# Patient Record
Sex: Female | Born: 1950 | Race: White | Hispanic: No | Marital: Married | Smoking: Never smoker
Health system: Southern US, Community
[De-identification: ages and names within clinical notes are randomized; demographics above are authoritative.]

## PROBLEM LIST (undated history)

## (undated) DIAGNOSIS — D649 Anemia, unspecified: Secondary | ICD-10-CM

## (undated) DIAGNOSIS — K219 Gastro-esophageal reflux disease without esophagitis: Secondary | ICD-10-CM

## (undated) DIAGNOSIS — Z8619 Personal history of other infectious and parasitic diseases: Secondary | ICD-10-CM

## (undated) DIAGNOSIS — C801 Malignant (primary) neoplasm, unspecified: Secondary | ICD-10-CM

## (undated) DIAGNOSIS — L709 Acne, unspecified: Secondary | ICD-10-CM

## (undated) DIAGNOSIS — R739 Hyperglycemia, unspecified: Secondary | ICD-10-CM

## (undated) DIAGNOSIS — E079 Disorder of thyroid, unspecified: Secondary | ICD-10-CM

## (undated) DIAGNOSIS — E785 Hyperlipidemia, unspecified: Secondary | ICD-10-CM

## (undated) DIAGNOSIS — I10 Essential (primary) hypertension: Secondary | ICD-10-CM

## (undated) DIAGNOSIS — K635 Polyp of colon: Secondary | ICD-10-CM

## (undated) DIAGNOSIS — H04129 Dry eye syndrome of unspecified lacrimal gland: Secondary | ICD-10-CM

## (undated) DIAGNOSIS — R011 Cardiac murmur, unspecified: Secondary | ICD-10-CM

## (undated) DIAGNOSIS — G4730 Sleep apnea, unspecified: Secondary | ICD-10-CM

## (undated) DIAGNOSIS — IMO0002 Reserved for concepts with insufficient information to code with codable children: Secondary | ICD-10-CM

## (undated) DIAGNOSIS — M1990 Unspecified osteoarthritis, unspecified site: Secondary | ICD-10-CM

## (undated) HISTORY — DX: Dry eye syndrome of unspecified lacrimal gland: H04.129

## (undated) HISTORY — DX: Disorder of thyroid, unspecified: E07.9

## (undated) HISTORY — PX: SPINAL CORD STIMULATOR IMPLANT: SHX2422

## (undated) HISTORY — DX: Essential (primary) hypertension: I10

## (undated) HISTORY — DX: Sleep apnea, unspecified: G47.30

## (undated) HISTORY — DX: Reserved for concepts with insufficient information to code with codable children: IMO0002

## (undated) HISTORY — DX: Polyp of colon: K63.5

## (undated) HISTORY — DX: Hyperlipidemia, unspecified: E78.5

## (undated) HISTORY — DX: Anemia, unspecified: D64.9

## (undated) HISTORY — DX: Unspecified osteoarthritis, unspecified site: M19.90

## (undated) HISTORY — DX: Gastro-esophageal reflux disease without esophagitis: K21.9

## (undated) HISTORY — DX: Malignant (primary) neoplasm, unspecified: C80.1

## (undated) HISTORY — DX: Hyperglycemia, unspecified: R73.9

## (undated) HISTORY — PX: WISDOM TOOTH EXTRACTION: SHX21

## (undated) HISTORY — DX: Cardiac murmur, unspecified: R01.1

## (undated) HISTORY — DX: Personal history of other infectious and parasitic diseases: Z86.19

## (undated) HISTORY — DX: Acne, unspecified: L70.9

---
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
## 1994-12-01 DIAGNOSIS — C4491 Basal cell carcinoma of skin, unspecified: Secondary | ICD-10-CM

## 1994-12-01 HISTORY — DX: Basal cell carcinoma of skin, unspecified: C44.91

## 1997-08-14 ENCOUNTER — Other Ambulatory Visit: Admission: RE | Admit: 1997-08-14 | Discharge: 1997-08-14 | Payer: Self-pay | Admitting: Obstetrics and Gynecology

## 2002-06-26 DIAGNOSIS — E041 Nontoxic single thyroid nodule: Secondary | ICD-10-CM | POA: Insufficient documentation

## 2002-11-01 ENCOUNTER — Other Ambulatory Visit: Admission: RE | Admit: 2002-11-01 | Discharge: 2002-11-01 | Payer: Self-pay | Admitting: Diagnostic Radiology

## 2005-12-17 ENCOUNTER — Ambulatory Visit (HOSPITAL_BASED_OUTPATIENT_CLINIC_OR_DEPARTMENT_OTHER): Admission: RE | Admit: 2005-12-17 | Discharge: 2005-12-17 | Payer: Self-pay | Admitting: Family Medicine

## 2005-12-21 ENCOUNTER — Ambulatory Visit: Payer: Self-pay | Admitting: Internal Medicine

## 2006-02-02 ENCOUNTER — Ambulatory Visit (HOSPITAL_BASED_OUTPATIENT_CLINIC_OR_DEPARTMENT_OTHER): Admission: RE | Admit: 2006-02-02 | Discharge: 2006-02-02 | Payer: Self-pay | Admitting: Family Medicine

## 2006-02-15 ENCOUNTER — Ambulatory Visit: Payer: Self-pay | Admitting: Internal Medicine

## 2007-12-13 ENCOUNTER — Encounter: Payer: Self-pay | Admitting: Family Medicine

## 2007-12-29 ENCOUNTER — Encounter: Payer: Self-pay | Admitting: Family Medicine

## 2008-02-18 LAB — HM COLONOSCOPY

## 2009-01-30 ENCOUNTER — Encounter: Payer: Self-pay | Admitting: Family Medicine

## 2009-02-04 ENCOUNTER — Emergency Department (HOSPITAL_COMMUNITY): Admission: EM | Admit: 2009-02-04 | Discharge: 2009-02-04 | Payer: Self-pay | Admitting: Emergency Medicine

## 2009-02-06 ENCOUNTER — Encounter: Admission: RE | Admit: 2009-02-06 | Discharge: 2009-02-06 | Payer: Self-pay | Admitting: Family Medicine

## 2009-02-06 ENCOUNTER — Encounter: Payer: Self-pay | Admitting: Family Medicine

## 2009-07-30 ENCOUNTER — Encounter: Payer: Self-pay | Admitting: Family Medicine

## 2009-12-11 ENCOUNTER — Encounter
Admission: RE | Admit: 2009-12-11 | Discharge: 2010-03-11 | Payer: Self-pay | Source: Home / Self Care | Attending: Family Medicine | Admitting: Family Medicine

## 2010-02-05 ENCOUNTER — Encounter
Admission: RE | Admit: 2010-02-05 | Discharge: 2010-02-05 | Payer: Self-pay | Source: Home / Self Care | Attending: Unknown Physician Specialty | Admitting: Unknown Physician Specialty

## 2010-03-15 ENCOUNTER — Encounter: Payer: Self-pay | Admitting: Family Medicine

## 2010-03-26 ENCOUNTER — Ambulatory Visit: Payer: Self-pay | Admitting: *Deleted

## 2010-03-26 ENCOUNTER — Encounter: Admit: 2010-03-26 | Payer: Self-pay | Admitting: Family Medicine

## 2010-04-23 ENCOUNTER — Ambulatory Visit: Payer: Self-pay | Admitting: *Deleted

## 2010-04-24 ENCOUNTER — Ambulatory Visit (INDEPENDENT_AMBULATORY_CARE_PROVIDER_SITE_OTHER): Payer: PRIVATE HEALTH INSURANCE | Admitting: Family Medicine

## 2010-04-24 ENCOUNTER — Other Ambulatory Visit: Payer: Self-pay | Admitting: Family Medicine

## 2010-04-24 ENCOUNTER — Encounter: Payer: Self-pay | Admitting: Family Medicine

## 2010-04-24 DIAGNOSIS — R030 Elevated blood-pressure reading, without diagnosis of hypertension: Secondary | ICD-10-CM | POA: Insufficient documentation

## 2010-04-24 DIAGNOSIS — Z1231 Encounter for screening mammogram for malignant neoplasm of breast: Secondary | ICD-10-CM

## 2010-04-24 DIAGNOSIS — R011 Cardiac murmur, unspecified: Secondary | ICD-10-CM | POA: Insufficient documentation

## 2010-04-24 DIAGNOSIS — M8580 Other specified disorders of bone density and structure, unspecified site: Secondary | ICD-10-CM | POA: Insufficient documentation

## 2010-04-24 DIAGNOSIS — Z85828 Personal history of other malignant neoplasm of skin: Secondary | ICD-10-CM | POA: Insufficient documentation

## 2010-04-24 DIAGNOSIS — D649 Anemia, unspecified: Secondary | ICD-10-CM

## 2010-04-24 DIAGNOSIS — E7800 Pure hypercholesterolemia, unspecified: Secondary | ICD-10-CM

## 2010-04-24 DIAGNOSIS — M810 Age-related osteoporosis without current pathological fracture: Secondary | ICD-10-CM

## 2010-04-24 DIAGNOSIS — E079 Disorder of thyroid, unspecified: Secondary | ICD-10-CM

## 2010-04-24 DIAGNOSIS — E042 Nontoxic multinodular goiter: Secondary | ICD-10-CM | POA: Insufficient documentation

## 2010-04-24 DIAGNOSIS — Z23 Encounter for immunization: Secondary | ICD-10-CM

## 2010-04-24 DIAGNOSIS — D126 Benign neoplasm of colon, unspecified: Secondary | ICD-10-CM | POA: Insufficient documentation

## 2010-04-24 DIAGNOSIS — Z9189 Other specified personal risk factors, not elsewhere classified: Secondary | ICD-10-CM | POA: Insufficient documentation

## 2010-04-24 LAB — CBC WITH DIFFERENTIAL/PLATELET
Basophils Absolute: 0.1 10*3/uL (ref 0.0–0.1)
Basophils Relative: 0.7 % (ref 0.0–3.0)
Eosinophils Relative: 5.8 % — ABNORMAL HIGH (ref 0.0–5.0)
HCT: 37.2 % (ref 36.0–46.0)
Hemoglobin: 12.4 g/dL (ref 12.0–15.0)
Lymphocytes Relative: 23.8 % (ref 12.0–46.0)
Lymphs Abs: 1.6 10*3/uL (ref 0.7–4.0)
Monocytes Relative: 11 % (ref 3.0–12.0)
Neutro Abs: 4 10*3/uL (ref 1.4–7.7)
RBC: 4.28 Mil/uL (ref 3.87–5.11)
WBC: 6.9 10*3/uL (ref 4.5–10.5)

## 2010-04-24 LAB — IBC PANEL
Saturation Ratios: 14.8 % — ABNORMAL LOW (ref 20.0–50.0)
Transferrin: 346.9 mg/dL (ref 212.0–360.0)

## 2010-04-30 NOTE — Letter (Signed)
Summary: Patient Questionnaire  Patient Questionnaire   Imported By: Beau Fanny 04/24/2010 14:24:35  _____________________________________________________________________  External Attachment:    Type:   Image     Comment:   External Document

## 2010-04-30 NOTE — Assessment & Plan Note (Signed)
Summary: NEW PATIENT EST. / LFW   Vital Signs:  Patient profile:   60 year old female Height:      61.5 inches Weight:      208 pounds BMI:     38.80 Temp:     97.9 degrees F oral Pulse rate:   84 / minute Pulse rhythm:   regular BP sitting:   126 / 84  (left arm) Cuff size:   large  Vitals Entered By: Lewanda Rife LPN (April 23, 1476 11:21 AM) CC: new pt to establish   History of Present Illness: here to get established as new pt complex med history  [facility] on [place] - and they closed   HTN - well controlled on diet and exercise - no meds for 6 years  hyperlipidemia -- controlled on statin -- but has been out of it for a month   obesity with bmi of 38  hyperglycemia - watching with AIC of 5.8 this summer  mindful now of sugar and carbs in diet   sleep apnea on cpap which helps  does not see a specialist for that -- has been more than 5 years ago - working great   gerd on med - prilosec two times a day   OP wiht dexa 11/09- FN t score of -2.5 is due for one  no meds - never disc it  takes ca and vit D  vit D def- 22 in june -- is low -- takes 2000 international units per day  at one time took weekly therapy  pap 10/09  mam 12/10-- is overdue needs to schedule no lumps on self exam  labs in 6/11  Td over 10 years go   ptx 2005-- will get that in the fall  gets flu shots in oct   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 what the plan will be for that  has not seen endocrinologist   hx of anemia -10.? [facility]    Preventive Screening-Counseling & Management  Alcohol-Tobacco     Smoking Status: never  Caffeine-Diet-Exercise     Does Patient Exercise: no      Drug Use:  no.    Allergies (verified): 1)  ! * Nitrous Oxide  Past History:  Past Medical History: Anemia-NOS Skin cancer, hx of arthritis spine- deg joint dz (pain clinic)  hx of chicken pox GERD Heart murmur HTN  high cholesterol polyps in colon thyroid problem hyperglycemia  sleep apnea with cpap acne dry eye syndrome osteoporosis vit D deficiency thyroid nodule with neg bx in the past    cardiol- [place] opthy Ellie Lunch at [facility]  Tafeen-derm   Past Surgical History: Tonsillectomy 1973 colonosc 1/10-- polyp- needs every 3 years  Family History: father living arthritis, elevated cholesterol, heart disease,highBP mother:living arthritis,diabetes, thyroid cancer maternal grandmother:kidney disease and diabetes brother: heart disease stent before age 26  and high blood pressure, thyroid cancer   no breast or colon cancer   Social History: Occupation:accountant Married Never Smoked Alcohol use-yes Drug use-no Regular exercise-no G2P2 Occupation:  employed Does Patient Exercise:  no Smoking Status:  never Drug Use:  no  Review of Systems General:  Denies fatigue and malaise. Eyes:  Denies blurring and eye irritation. CV:  Denies chest pain or discomfort, lightheadness, and palpitations. Resp:  Denies cough, shortness of breath, and wheezing. GI:  Denies abdominal pain and indigestion. GU:  Denies dysuria and urinary frequency. MS:  Complains of low back pain, mid back pain, and stiffness; denies joint redness, joint swelling, and muscle weakness. Derm:  Denies lesion(s), poor wound healing, and rash. Neuro:  Denies numbness and tingling. Psych:  Denies anxiety and depression. Endo:  Denies cold intolerance, excessive thirst, excessive urination, and heat  intolerance. Heme:  Denies abnormal bruising and bleeding.  Physical Exam  General:  overweight but generally well appearing  Head:  normocephalic, atraumatic, and no abnormalities observed.   Eyes:  vision grossly intact, pupils equal, pupils round, and pupils reactive to light.   Nose:  no nasal discharge.   Mouth:  pharynx pink and moist.   Neck:  supple with full rom and no masses or thyromegally, no JVD or carotid bruit  Chest Wall:  No deformities, masses, or tenderness noted. Lungs:  Normal respiratory effort, chest expands symmetrically. Lungs are clear to auscultation, no crackles or wheezes. Heart:  Normal rate and regular rhythm. S1 and S2 normal without gallop, murmur, click, rub or other extra sounds. Abdomen:  Bowel sounds positive,abdomen soft and non-tender without masses, organomegaly or hernias noted. no renal  bruits  Msk:  No deformity or scoliosis noted of thoracic or lumbar spine.  poor rom back  Pulses:  R and L carotid,radial,femoral,dorsalis pedis and posterior tibial pulses are full and equal bilaterally Extremities:  No clubbing, cyanosis, edema, or deformity noted with normal full range of motion of all joints.   Neurologic:  sensation intact to light touch, gait normal, and DTRs symmetrical and normal.   Skin:  Intact without suspicious lesions or rashes Cervical Nodes:  No lymphadenopathy noted Inguinal Nodes:  No significant adenopathy Psych:  normal affect, talkative and pleasant    Impression & Recommendations:  Problem # 1:  OSTEOPOROSIS (ICD-733.00) Assessment New schedule dexa  no meds at present  rev ca and vit D disc results after  Her updated medication list for this problem includes:    Vitamin D 1000 Unit Tabs (Cholecalciferol) .Marland Kitchen... 1-2 tabs by mouth daily  Orders: Radiology Referral (Radiology)  Problem # 2:  OTHER SCREENING MAMMOGRAM (ICD-V76.12) Assessment: New schedule annual mam  recent breast exam nl  enc regular self exams   Orders: Radiology Referral (Radiology)  Problem # 3:  UNSPECIFIED DISORDER OF THYROID (ICD-246.9) Assessment: New ? hx of benign thyroid nodule with fam hx of thyroid cancer will watch this and labs rev most recent labs  Problem # 4:  HYPERCHOLESTEROLEMIA (ICD-272.0) Assessment: New  has been well controlled with statin and diet  will keep these up  re check in oct before PE Her updated medication list for this problem includes:    Simvastatin 40 Mg Tabs (Simvastatin) .Marland Kitchen... Take 1 tablet by mouth once a day  Orders: Prescription Created Electronically [phone])  Problem # 5:  ANEMIA-NOS (ICD-285.9) Assessment: New per pt hb was high and then low - with no reason [facility]  today with B12 and iron studies Orders: Venipuncture ([address]) TLB-B12 + Folate Pnl (60454_09811-B14/NWG) TLB-IBC Pnl (Iron/FE;Transferrin) (83550-IBC) TLB-CBC Platelet - w/Differential (85025-CBCD) TLB-Ferritin (82728-FER)  Complete Medication List: 1)  Simvastatin 40 Mg Tabs (Simvastatin) .... Take 1 tablet by mouth once a day 2)  Tens Unit (nerve Stimulator)  .... As directed 3)  Lyrica 75 Mg Caps (Pregabalin) .Marland Kitchen.. 1 capsule by mouth twice a day 4)  Etodolac 500 Mg Tabs (Etodolac) .... One tablet by mouth twice a day. 5)  Omeprazole 20 Mg Cpdr (Omeprazole) .... One tablet by mouth twice a day 6)  Ziana 1.2-0.025 % Gel (Clindamycin-tretinoin) .... Apply topical gel to acne daily 7)  Tums Calcium For Life Bone 750 Mg Chew (Calcium carbonate antacid) .... Chew 2 tums daily 8)  Vitamin D 1000 Unit Tabs (Cholecalciferol) .Marland Kitchen.. 1-2 tabs by mouth daily 9)  Smz-tmp Ds 800-160 Mg Tabs (Sulfamethoxazole-trimethoprim) .... Take 1 tablet by mouth once a day for acne 10)  Lidoderm 5 % Ptch (Lidocaine) .... Apply 2 patches daily 12 hrs on and 12 hrs off. 11)  Tylenol Pm Extra Strength 500-25 Mg Tabs (Diphenhydramine-apap (sleep)) .... Otc as directed.  Other Orders: Tdap =>  28yrs IM (95621) Admin 1st Vaccine (30865)  Patient Instructions: 1)  tetnus shot today 2)  we will schedule mammogram and dexa at check out  3)  schedule PE in early october with labs prior  4)  labs for anemia today  Prescriptions: SIMVASTATIN 40 MG TABS (SIMVASTATIN) Take 1 tablet by mouth once a day  #30 x 11   Entered and Authorized by:   Judith Part MD   Signed by:   Judith Part MD on 04/24/2010   Method used:   Electronically to        [facility] (retail)       [address]       [place], [place]  [address]       Ph: [phone]       Fax: [phone]   RxID:   [id number]    Orders Added: 1)  Venipuncture [75643] 2)  TLB-B12 + Folate Pnl [82746_82607-B12/FOL] 3)  TLB-IBC Pnl (Iron/FE;Transferrin) [83550-IBC] 4)  TLB-CBC Platelet - w/Differential [85025-CBCD] 5)  TLB-Ferritin [82728-FER] 6)  Radiology Referral [Radiology] 7)  Radiology Referral [Radiology] 8)  Tdap => 85yrs IM [90715] 9)  Admin 1st Vaccine [90471] 10)  Prescription Created Electronically [phone] 11)  New Patient Level IV [88416]   Immunizations Administered:  Tetanus Vaccine:    Vaccine Type: Tdap    Site: right deltoid    Mfr: GlaxoSmithKline    Dose: 0.5 ml    Route: IM    Given by: Selena Batten Dance CMA (AAMA)    Exp. Date: 12/07/2011    Lot #: [id number]    VIS given: 01/05/08 version given April 24, 2010.   Immunizations Administered:  Tetanus Vaccine:    Vaccine Type: Tdap    Site: right deltoid    Mfr: GlaxoSmithKline    Dose: 0.5 ml    Route: IM    Given by: Selena Batten Dance CMA (AAMA)    Exp. Date: 12/07/2011    Lot #: [id number]    VIS given: 01/05/08 version given April 24, 2010.  Prior Medications: SIMVASTATIN 40 MG TABS (SIMVASTATIN) Take 1 tablet by mouth once a day TENS UNIT (NERVE STIMULATOR) () As directed LYRICA 75 MG CAPS (PREGABALIN) 1 capsule by mouth twice a  day ETODOLAC 500 MG TABS (ETODOLAC) one tablet by mouth  twice a day. OMEPRAZOLE 20 MG CPDR (OMEPRAZOLE) One tablet by mouth twice a day ZIANA 1.2-0.025 % GEL (CLINDAMYCIN-TRETINOIN) apply topical gel to acne daily TUMS CALCIUM FOR LIFE BONE 750 MG CHEW (CALCIUM CARBONATE ANTACID) chew 2 tums daily VITAMIN D 1000 UNIT TABS (CHOLECALCIFEROL) 1-2 tabs by mouth daily SMZ-TMP DS 800-160 MG TABS (SULFAMETHOXAZOLE-TRIMETHOPRIM) Take 1 tablet by mouth once a day for acne LIDODERM 5 % PTCH (LIDOCAINE) apply 2 patches daily 12 hrs on and 12 hrs off. TYLENOL PM EXTRA STRENGTH 500-25 MG TABS (DIPHENHYDRAMINE-APAP (SLEEP)) OTC As directed. Current Allergies (reviewed today): ! * NITROUS OXIDE   Preventive Care Screening  Colonoscopy:    Date:  02/18/2008    Next Due:  02/2011    Results:  Adenomatous Polyp

## 2010-05-01 ENCOUNTER — Encounter: Payer: Self-pay | Admitting: Family Medicine

## 2010-05-01 LAB — HM MAMMOGRAPHY: HM Mammogram: NORMAL

## 2010-05-06 ENCOUNTER — Encounter: Payer: Self-pay | Admitting: Family Medicine

## 2010-05-07 NOTE — Letter (Signed)
Summary: [facility]   [facility]   Imported By: Kassie Mends 04/29/2010 09:21:14  _____________________________________________________________________  External Attachment:    Type:   Image     Comment:   External Document

## 2010-05-15 ENCOUNTER — Encounter: Payer: Self-pay | Admitting: Family Medicine

## 2010-05-16 NOTE — Letter (Signed)
Summary: Results Follow up Letter  [facility]  [address] [place], [place] [address]   Phone: [phone]  Fax: [phone]    05/06/2010 MRN: [id number]  Lori Lawrence [address] [facility] [place], [place]  [address]  [place]  Dear Ms. LASKIN,  The following are the results of your recent test(s):  Test         Result    Pap Smear:        Normal _____  Not Normal _____ Comments: ______________________________________________________ Cholesterol: LDL(Bad cholesterol):         Your goal is less than:         HDL (Good cholesterol):       Your goal is more than: Comments:  ______________________________________________________ Mammogram:        Normal __X___  Not Normal _____ Comments:Repeat in one year.   ___________________________________________________________________ Hemoccult:        Normal _____  Not normal _______ Comments:    _____________________________________________________________________ Other Tests:    We routinely do not discuss normal results over the telephone.  If you desire a copy of the results, or you have any questions about this information we can discuss them at your next office visit.   Sincerely,    Idamae Schuller Tower,MD  MT/ri

## 2010-05-22 ENCOUNTER — Encounter: Payer: Self-pay | Admitting: Family Medicine

## 2010-05-31 ENCOUNTER — Other Ambulatory Visit: Payer: Self-pay | Admitting: *Deleted

## 2010-05-31 MED ORDER — ETODOLAC 500 MG PO TABS: 500.0000 mg | ORAL_TABLET | Freq: Two times a day (BID) | ORAL | Status: AC | PRN

## 2010-05-31 NOTE — Telephone Encounter (Signed)
Px written for call in   

## 2010-05-31 NOTE — Telephone Encounter (Signed)
Medication phoned to [facility] [phone] pharmacy as instructed.

## 2010-06-04 ENCOUNTER — Telehealth: Payer: Self-pay | Admitting: *Deleted

## 2010-06-04 NOTE — Telephone Encounter (Signed)
Her dexa shows osteopenia (not full blown osteoporosis )  Her score at the hip is improved  Keep taking ca and D Will disc further at her next follow up Please note dexa on 05/01/10 on her health mt list-thanks I will sign the dexa to be scanned

## 2010-06-04 NOTE — Telephone Encounter (Signed)
Per Patient she has been calling for the past couple of weeks for her bone density results. I looked in her chart and we still had not received the results. I called [facility] and they said that they had sent it. They faxed another copy . Gave it to Dr. Milinda Antis to review.

## 2010-06-04 NOTE — Telephone Encounter (Signed)
Patient notified as instructed by telephone. Dexa added to health maintenance as instructed.Dr Milinda Antis will sign dexa to be scanned.

## 2010-06-06 ENCOUNTER — Encounter: Payer: Self-pay | Admitting: Family Medicine

## 2010-07-05 NOTE — Procedures (Signed)
NAMEMATIKA, BARTELL                ACCOUNT NO.:  [id number]   MEDICAL RECORD NO.:  [id number]          PATIENT TYPE:  OUT   LOCATION:  SLEEP CENTER                 FACILITY:  [facility]   PHYSICIAN:  Clinton D. Maple Hudson, MD, FCCP, FACPDATE OF BIRTH:  08[birth year]1952   DATE OF STUDY:  12/17/2005                              NOCTURNAL POLYSOMNOGRAM   REFERRING PHYSICIAN:  Marjory Lies, M.D.   INDICATION FOR STUDY:  Hypersomnia with sleep apnea.   EPWORTH SLEEPINESS SCORE:  14/24.   BMI:  34.   WEIGHT:  188 pounds.   HOME MEDICATIONS:  1. Benicar.  2. Lipitor.  3. Synthroid.  4. Celebrex.  5. Bactrim.   SLEEP ARCHITECTURE:  Total sleep time 392 minutes with sleep efficiency 89%.  Stage 1 with 4%.  Stage 2 63%.  Stage 3 and 4 10%, REM 23% of total sleep  time.  Sleep latency 19 minutes, REM latency 96 minutes, awake after sleep  onset 35 minutes, arousal index 10. No bed time medication was reported.   RESPIRATORY DATA:  Apnea hypopnea index (AHI, RDI) 28.5 obstructive events  per hour indicating moderate obstructive sleep apnea/hypopnea syndrome.  There were 94 obstructive apneas and 92 hypopnea's.  The events were not  positional.  REM AHI 48.9 per hour.  She had insufficient early event and  sleep to permit CPAP titration by split study protocol.   OXYGEN DATA:  Moderate snoring with oxygen desaturation to a nadir of 84%.  Mean oxygen saturation through the study was 94% on room air.   CARDIAC DATA:  Sinus rhythm with sinus tachycardia up to 120 beats per  minute.  Had occasional PVC.   MOVEMENT/PARASOMNIA:  Occasional limb jerk with little effect on sleep.  Complaints of back pain.   IMPRESSION/RECOMMENDATION:  1. Moderate obstructive sleep apnea/hypopnea syndrome, AHI 28.5 per hour      with nonpositional events, moderate snoring and oxygen desaturation to      a nadir of 84%.  2. Consider return for CPAP titration or evaluate for alternate therapies      as appropriate.  3.  Complaints of back pain.      Clinton D. Maple Hudson, MD, [facility], FACP  Diplomate, Biomedical engineer of Sleep Medicine  Electronically Signed     CDY/MEDQ  D:  12/21/2005 10:29:37  T:  12/22/2005 07:41:42  Job:  [id number]

## 2010-07-05 NOTE — Procedures (Signed)
NAMEHISAKO, BUGH                ACCOUNT NO.:  [id number]   MEDICAL RECORD NO.:  [id number]          PATIENT TYPE:  OUT   LOCATION:  SLEEP CENTER                 FACILITY:  [facility]   PHYSICIAN:  Clinton D. Maple Hudson, MD, FCCP, FACPDATE OF BIRTH:  1950/04/29   DATE OF STUDY:  02/02/2006                            NOCTURNAL POLYSOMNOGRAM   INDICATION FOR STUDY:  Hypersomnia with sleep apnea.   EPWORTH SLEEPINESS SCORE:  14/24.   BMI:  33.5.  Weight 185 pounds.   A baseline diagnostic NPSG on December 17, 2005 recorded an AHI of 28.5  per hour.  CPAP titration is requested.   MEDICATIONS:  Home medications are listed and reviewed.   SLEEP ARCHITECTURE:  Total sleep time 297 minutes, with sleep deficiency  76%.  Stage I was 5%, stage II 62%, stages III and IV 12%, REM 21% of  total sleep time.  Sleep latency 31 minutes, REM  latency 84 minutes,  awake after sleep onset 61 minutes, arousal index 4.8.  No bedtime  medication was reported.   RESPIRATORY DATA:  CPAP titration protocol.  CPAP was titrated to 9 CWP,  AHI 1.2 per hour.  A small full face ResMed Mirage Quattro mask was used  with heated humidifier.   OXYGEN DATA:  Snoring was prevented and oxygen saturation held at 96% on  CPAP.   CARDIAC DATA:  Sinus rhythm, with sinus tachycardia up to 112 per  minute, especially during REM.   MOVEMENT-PARASOMNIA:  No significant limb jerks or movement abnormality.  Bathroom x1.   IMPRESSIONS-RECOMMENDATIONS:  1. Successful CPAP titration to 9 CWP, AHI 1.2 per hour.  A small      ResMed full face Mirage Quattro mask was used with heated      humidifier.  2. Baseline diagnostic NPSG on December 17, 2005 gave an AHI of 28.5      per hour.      Clinton D. Maple Hudson, MD, [facility], FACP  Diplomate, Biomedical engineer of Sleep Medicine  Electronically Signed    CDY/MEDQ  D:  02/15/2006 15:54:14  T:  02/16/2006 08:00:35  Job:  [id number]

## 2010-08-01 ENCOUNTER — Encounter: Payer: Self-pay | Admitting: Family Medicine

## 2010-08-02 ENCOUNTER — Encounter: Payer: Self-pay | Admitting: Family Medicine

## 2010-08-02 ENCOUNTER — Ambulatory Visit (INDEPENDENT_AMBULATORY_CARE_PROVIDER_SITE_OTHER): Payer: PRIVATE HEALTH INSURANCE | Admitting: Family Medicine

## 2010-08-02 DIAGNOSIS — R5381 Other malaise: Secondary | ICD-10-CM

## 2010-08-02 DIAGNOSIS — M5136 Other intervertebral disc degeneration, lumbar region: Secondary | ICD-10-CM | POA: Insufficient documentation

## 2010-08-02 DIAGNOSIS — M5137 Other intervertebral disc degeneration, lumbosacral region: Secondary | ICD-10-CM

## 2010-08-02 DIAGNOSIS — R5383 Other fatigue: Secondary | ICD-10-CM

## 2010-08-02 NOTE — Assessment & Plan Note (Signed)
With chronic limiting pain that has prompted wt gain Non surgical per pt  Will go forward with implantation of nerve stimulator If no imp perhaps 2nd surgical opinion  In meantime I think water exercise (which req freeing up time) would be helpful  Will keep [place] updated

## 2010-08-02 NOTE — Patient Instructions (Signed)
I think you need to cut your work hours  You need at least 30-60 minutes per day to do water exercise  Also prep healthy food and work on weight loss I recommend weight watchers on line  I hope this procedure really helps your pain

## 2010-08-02 NOTE — Progress Notes (Signed)
Subjective:    Patient ID: Lori Lawrence, female    DOB: [birth year], 60 y.o.   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 COLONIC POLYPS  . UNSPECIFIED DISORDER OF THYROID  . HYPERCHOLESTEROLEMIA  . ANEMIA-NOS  . OSTEOPOROSIS  . MURMUR  . ELEVATED BP READING WITHOUT DX HYPERTENSION  . SKIN CANCER, HX OF  . CHICKENPOX, HX OF  . Degenerative disc disease, lumbar   Past Medical History  Diagnosis Date  . Anemia     NOS  . Cancer     hx of skin CA  . Arthritis   . DDD (degenerative disc disease)     of the spine (pain clinic)  . History of chicken pox   . GERD (gastroesophageal reflux disease)   . Heart murmur   . Hyperlipidemia   . Hypertension   . Colon polyp   . Hyperglycemia   . Acne   . Sleep apnea     wilth cpap  . Dry eye syndrome   . Osteoporosis   . Vitamin D deficiency   . Thyroid disease     thyroid problem thyroid nodule with neg bx in past   Past Surgical History  Procedure Date  . Tonsillectomy 1973   History  Substance Use Topics  . Smoking status: Never Smoker   . Smokeless tobacco: Not on file  . Alcohol Use: Yes   Family History  Problem Relation Age of Onset  . Arthritis Mother   . Cancer Mother     thyroid CA  . Diabetes Mother   . Arthritis Father   . Heart disease Father   . Hyperlipidemia Father   .  Hypertension Father   . Cancer Brother     thyroid CA  . Hypertension Brother   . Heart disease Brother     stent before age 62.  . Diabetes Maternal Grandmother   . Kidney disease Maternal Grandmother    Allergies  Allergen Reactions  . Lyrica (Pregabalin)     sleepy   Current Outpatient Prescriptions on File Prior to Visit  Medication Sig Dispense Refill  . calcium carbonate (TUMS CALCIUM FOR LIFE BONE) 750 MG chewable tablet Chew 2 tablets by mouth daily.        . cholecalciferol (VITAMIN D) 1000 UNITS tablet Take 1,000-2,000 Units by mouth daily.        . clindamycin-tretinoin (ZIANA) gel Apply topically at bedtime.        Marland Kitchen etodolac (LODINE) 500 MG tablet Take 1 tablet (500 mg total) by mouth 2 (two) times daily as needed (take with food prn pain ).  60 tablet  11  . lidocaine (LIDODERM) 5 % Apply 2 patches daily  12 hrs on and 12 hrs off.       . omeprazole (PRILOSEC) 20 MG capsule Take 20 mg by mouth 2 (two) times daily.        . simvastatin (ZOCOR) 40 MG tablet Take 40 mg by mouth at bedtime.        . sulfamethoxazole-trimethoprim (BACTRIM DS,SEPTRA DS) 800-160 MG per tablet Take 1 tablet by mouth daily. For acne.       . diphenhydramine-acetaminophen (TYLENOL PM EXTRA STRENGTH) 25-500 MG TABS Take 1 tablet by mouth at bedtime as needed.        . pregabalin (LYRICA) 75 MG capsule Take 75 mg by mouth 2 (two) times daily.              Review of Systems Review of Systems  Constitutional: Negative for fever, appetite change, fatigue and unexpected weight change.  Eyes: Negative for pain and visual disturbance.  Respiratory: Negative for cough and shortness of breath.   Cardiovascular: Negative.  For cp or palp Gastrointestinal: Negative for nausea, diarrhea and constipation.  Genitourinary: Negative for urgency and frequency.  Skin: Negative for pallor.  MSK pos for severe and limiting back pain that rad to legs Neurological: Negative for weakness, light-headedness,  numbness and headaches.  Hematological: Negative for adenopathy. Does not bruise/bleed easily.  Psychiatric/Behavioral: Negative for dysphoric mood. The patient is not nervous/anxious.          Objective:   Physical Exam  Constitutional: She appears well-developed and well-nourished. No distress.       Obese and well appearing  Labored ambulation due to back pain   HENT:  Head: Normocephalic and atraumatic.  Eyes: Conjunctivae and EOM are normal. Pupils are equal, round, and reactive to light.  Neck: Normal range of motion. Neck supple. No JVD present.  Cardiovascular: Normal rate, regular rhythm, normal heart sounds and intact distal pulses.   Pulmonary/Chest: Effort normal and breath sounds normal.  Abdominal: Soft. Bowel sounds are normal. She exhibits no distension and no mass. There is no tenderness.  Musculoskeletal: She exhibits edema and tenderness.       Poor rom LS Labored ambulation  Tender LS lower- bony Pain on slr bilat  No acute joint changes   Lymphadenopathy:    She has no cervical adenopathy.  Neurological: She is alert. She has normal reflexes.  Skin: Skin is warm and dry. No rash noted. No pallor.  Psychiatric: She has a normal mood and affect.          Assessment & Plan:

## 2010-08-04 DIAGNOSIS — R5383 Other fatigue: Secondary | ICD-10-CM | POA: Insufficient documentation

## 2010-08-04 NOTE — Assessment & Plan Note (Signed)
Suspect due to wt gain/ inactivity/ chronic pain and terrible schedule Disc lifestyle change in detail- see inst

## 2010-09-24 ENCOUNTER — Other Ambulatory Visit: Payer: Self-pay

## 2010-09-24 MED ORDER — OMEPRAZOLE 20 MG PO CPDR: 20.0000 mg | DELAYED_RELEASE_CAPSULE | Freq: Two times a day (BID) | ORAL | Status: DC

## 2010-09-24 NOTE — Telephone Encounter (Signed)
[facility] faxed refill request for Omeprazole 20mg  #60 x 5 refill sent electronically.

## 2010-11-11 ENCOUNTER — Telehealth: Payer: Self-pay | Admitting: Family Medicine

## 2010-11-11 DIAGNOSIS — Z0000 Encounter for general adult medical examination without abnormal findings: Secondary | ICD-10-CM | POA: Insufficient documentation

## 2010-11-11 DIAGNOSIS — M810 Age-related osteoporosis without current pathological fracture: Secondary | ICD-10-CM

## 2010-11-11 DIAGNOSIS — D649 Anemia, unspecified: Secondary | ICD-10-CM

## 2010-11-11 DIAGNOSIS — E7800 Pure hypercholesterolemia, unspecified: Secondary | ICD-10-CM

## 2010-11-11 NOTE — Telephone Encounter (Signed)
Message copied by Judy Pimple on Mon Nov 11, 2010  8:47 PM ------      Message from: Baldomero Lamy      Created: Mon Nov 11, 2010 10:16 AM      Regarding: cpx labs wed 9/26       Please order  future cpx labs for pt's upcomming lab appt.      Thanks      Rodney Booze

## 2010-11-13 ENCOUNTER — Other Ambulatory Visit (INDEPENDENT_AMBULATORY_CARE_PROVIDER_SITE_OTHER): Payer: PRIVATE HEALTH INSURANCE

## 2010-11-13 DIAGNOSIS — E7800 Pure hypercholesterolemia, unspecified: Secondary | ICD-10-CM

## 2010-11-13 DIAGNOSIS — M810 Age-related osteoporosis without current pathological fracture: Secondary | ICD-10-CM

## 2010-11-13 DIAGNOSIS — D649 Anemia, unspecified: Secondary | ICD-10-CM

## 2010-11-13 DIAGNOSIS — Z0000 Encounter for general adult medical examination without abnormal findings: Secondary | ICD-10-CM

## 2010-11-13 LAB — COMPREHENSIVE METABOLIC PANEL
ALT: 36 U/L — ABNORMAL HIGH (ref 0–35)
CO2: 26 mEq/L (ref 19–32)
Calcium: 9.7 mg/dL (ref 8.4–10.5)
Chloride: 107 mEq/L (ref 96–112)
Creatinine, Ser: 0.9 mg/dL (ref 0.4–1.2)
GFR: 67.48 mL/min (ref >60.00)
Glucose, Bld: 109 mg/dL — ABNORMAL HIGH (ref 70–99)
Total Protein: 6.9 g/dL (ref 6.0–8.3)

## 2010-11-13 LAB — LIPID PANEL: Cholesterol: 201 mg/dL — ABNORMAL HIGH (ref 0–200)

## 2010-11-13 LAB — CBC WITH DIFFERENTIAL/PLATELET
Basophils Absolute: 0.1 10*3/uL (ref 0.0–0.1)
Eosinophils Relative: 5.3 % — ABNORMAL HIGH (ref 0.0–5.0)
HCT: 36.9 % (ref 36.0–46.0)
Hemoglobin: 12.2 g/dL (ref 12.0–15.0)
Lymphocytes Relative: 21.2 % (ref 12.0–46.0)
Monocytes Relative: 9.6 % (ref 3.0–12.0)
Neutro Abs: 4 10*3/uL (ref 1.4–7.7)
RBC: 4.15 Mil/uL (ref 3.87–5.11)
RDW: 14 % (ref 11.5–14.6)
WBC: 6.3 10*3/uL (ref 4.5–10.5)

## 2010-11-13 LAB — LDL CHOLESTEROL, DIRECT: Direct LDL: 121.8 mg/dL

## 2010-11-14 LAB — VITAMIN D 25 HYDROXY (VIT D DEFICIENCY, FRACTURES): Vit D, 25-Hydroxy: 37 ng/mL (ref 30–89)

## 2010-11-18 ENCOUNTER — Other Ambulatory Visit (HOSPITAL_COMMUNITY)
Admission: RE | Admit: 2010-11-18 | Discharge: 2010-11-18 | Disposition: A | Discharge status: Home / Self Care | Payer: PRIVATE HEALTH INSURANCE | Source: Ambulatory Visit | Attending: Family Medicine | Admitting: Family Medicine

## 2010-11-18 ENCOUNTER — Ambulatory Visit (INDEPENDENT_AMBULATORY_CARE_PROVIDER_SITE_OTHER): Payer: PRIVATE HEALTH INSURANCE | Admitting: Family Medicine

## 2010-11-18 ENCOUNTER — Encounter: Payer: Self-pay | Admitting: Family Medicine

## 2010-11-18 VITALS — BP 138/78 | HR 88 | Temp 97.9°F | Ht 61.5 in | Wt 209.5 lb

## 2010-11-18 DIAGNOSIS — Z01419 Encounter for gynecological examination (general) (routine) without abnormal findings: Secondary | ICD-10-CM

## 2010-11-18 DIAGNOSIS — Z1159 Encounter for screening for other viral diseases: Secondary | ICD-10-CM | POA: Insufficient documentation

## 2010-11-18 DIAGNOSIS — M51379 Other intervertebral disc degeneration, lumbosacral region without mention of lumbar back pain or lower extremity pain: Secondary | ICD-10-CM

## 2010-11-18 DIAGNOSIS — Z0000 Encounter for general adult medical examination without abnormal findings: Secondary | ICD-10-CM

## 2010-11-18 DIAGNOSIS — M5137 Other intervertebral disc degeneration, lumbosacral region: Secondary | ICD-10-CM

## 2010-11-18 DIAGNOSIS — E7800 Pure hypercholesterolemia, unspecified: Secondary | ICD-10-CM

## 2010-11-18 DIAGNOSIS — D126 Benign neoplasm of colon, unspecified: Secondary | ICD-10-CM

## 2010-11-18 DIAGNOSIS — M5136 Other intervertebral disc degeneration, lumbar region: Secondary | ICD-10-CM

## 2010-11-18 DIAGNOSIS — M810 Age-related osteoporosis without current pathological fracture: Secondary | ICD-10-CM

## 2010-11-18 NOTE — Assessment & Plan Note (Signed)
Due for dexa 1 year Last one improved Rev ca and d and exercise D level in tx range

## 2010-11-18 NOTE — Assessment & Plan Note (Addendum)
For spinal cord stimulator soon  Hopes to then be more active and loose wt

## 2010-11-18 NOTE — Assessment & Plan Note (Signed)
Lipids are up due to inc in ice cream this summer Rev lab with pt  Rev low sat fat diet in detail  Plans on changing diet and loosing wt Continues simvastatin

## 2010-11-18 NOTE — Assessment & Plan Note (Signed)
Due for colonosc 3 y f/u in [place]- that will be scheduled No stool changes

## 2010-11-18 NOTE — Assessment & Plan Note (Addendum)
  Reviewed health habits including diet and exercise and skin cancer prevention Also reviewed health mt list, fam hx and immunizations   Rev wellness labs in detail Will get flu shot at work

## 2010-11-18 NOTE — Patient Instructions (Addendum)
For weight loss decrease portions - and consider weight watchers program  Try water exercise  We will schedule colonoscopy at check out  Pap today Cholesterol is up - Avoid red meat/ fried foods/ egg yolks/ fatty breakfast meats/ butter, cheese and high fat dairy/ and shellfish   Also eat low sugar diet - stay away from sweets and sweet drinks

## 2010-11-18 NOTE — Assessment & Plan Note (Signed)
Screening exam with pap  No problems  Mam up to date and nl breast exam Enc regular self exams

## 2010-11-18 NOTE — Progress Notes (Signed)
Subjective:    Patient ID: Lori Lawrence, female    DOB: [birth year], 60 y.o.   MRN: [id number]  HPI Here for annual health mt exam and to review chronic medical problems  Is doing fairly well - hopes to be getting better soon  Scheduled a spinal cord implant- trial worked great - a stimulator  Surgical option was fusion - and does not want that Has disk dz and arthritis - with chronic sciatic nerve inflammation  Is very hopeful she will be able to loose wt and exercise   Wt is down 2 lb with bmi of 38 Diet- good choices -- and too much portion wise  Exercise- not able to do yet  Pap 10/09 normal  Is due for a 3 year pap No hx of abn paps No new partners  No gyn symptoms   Lipids are up with LDL 121 up from 90s Lab Results  Component Value Date   CHOL 201* 11/13/2010   Lab Results  Component Value Date   HDL 59.60 11/13/2010   No results found for this basename: [facility]   Lab Results  Component Value Date   TRIG 188.0* 11/13/2010   Lab Results  Component Value Date   CHOLHDL 3 11/13/2010   Lab Results  Component Value Date   LDLDIRECT 121.8 11/13/2010   on zocor 40 and diet  Has been eating ice cream - and eating once per week   Osteopenia dexa 3/12 Ca and D  Hyperglycemia mild fasting 109- relatively stable  Knows she needs to loose wt Plans to walk and join indoor pool soon after her spinal stimulator is put in   Colon polyp colonosc 1/10- due in jan 2013 - 3 year follow up -needs to schedule that    Flu shot- will get at work for free   Mam 312  No breast lumps or changes on self exam   Tdap 2/12  Goes to derm yearly  No new skin cancers   Patient Active Problem List  Diagnoses  . COLONIC POLYPS  . UNSPECIFIED DISORDER OF THYROID  . HYPERCHOLESTEROLEMIA  . Osteopenia  . MURMUR  . ELEVATED BP READING WITHOUT DX HYPERTENSION  . SKIN CANCER, HX OF  . CHICKENPOX, HX OF  . Degenerative disc disease, lumbar  . Routine general medical examination  at a health care facility  . Gynecological examination   Past Medical History  Diagnosis Date  . Anemia     NOS  . Cancer     hx of skin CA  . Arthritis   . DDD (degenerative disc disease)     of the spine (pain clinic)  . History of chicken pox   . GERD (gastroesophageal reflux disease)   . Heart murmur   . Hyperlipidemia   . Hypertension   . Colon polyp   . Hyperglycemia   . Acne   . Sleep apnea     wilth cpap  . Dry eye syndrome   . Osteoporosis   . Vitamin D deficiency   . Thyroid disease     thyroid problem thyroid nodule with neg bx in past   Past Surgical History  Procedure Date  . Tonsillectomy 1973   History  Substance Use Topics  . Smoking status: Never Smoker   . Smokeless tobacco: Not on file  . Alcohol Use: Yes   Family History  Problem Relation Age of Onset  . Arthritis Mother   . Cancer Mother     thyroid  CA  . Diabetes Mother   . Arthritis Father   . Heart disease Father   . Hyperlipidemia Father   . Hypertension Father   . Cancer Brother     thyroid CA  . Hypertension Brother   . Heart disease Brother     stent before age 65.  . Diabetes Maternal Grandmother   . Kidney disease Maternal Grandmother    Allergies  Allergen Reactions  . Lyrica (Pregabalin)     sleepy   Current Outpatient Prescriptions on File Prior to Visit  Medication Sig Dispense Refill  . calcium carbonate (TUMS CALCIUM FOR LIFE BONE) 750 MG chewable tablet Chew 2 tablets by mouth daily.        . cholecalciferol (VITAMIN D) 1000 UNITS tablet Take 1,000-2,000 Units by mouth daily.        . clindamycin-tretinoin (ZIANA) gel Apply topically at bedtime.        . diphenhydramine-acetaminophen (TYLENOL PM EXTRA STRENGTH) 25-500 MG TABS Take 1 tablet by mouth at bedtime as needed.        . etodolac (LODINE) 500 MG tablet Take 1 tablet (500 mg total) by mouth 2 (two) times daily as needed (take with food prn pain ).  60 tablet  11  . Gabapentin, PHN, (GRALISE) 600 MG TABS  Take 3 tablets by mouth at bedtime.        . lidocaine (LIDODERM) 5 % Apply 2 patches daily 12 hrs on and 12 hrs off.       . omeprazole (PRILOSEC) 20 MG capsule Take 1 capsule (20 mg total) by mouth 2 (two) times daily.  60 capsule  5  . simvastatin (ZOCOR) 40 MG tablet Take 40 mg by mouth at bedtime.        . sulfamethoxazole-trimethoprim (BACTRIM DS,SEPTRA DS) 800-160 MG per tablet Take 1 tablet by mouth daily. For acne.       . pregabalin (LYRICA) 75 MG capsule Take 75 mg by mouth 2 (two) times daily.            Review of Systems Review of Systems  Constitutional: Negative for fever, appetite change, fatigue and unexpected weight change.  Eyes: Negative for pain and visual disturbance.  Respiratory: Negative for cough and shortness of breath.   Cardiovascular: Negative for cp or palpitations    Gastrointestinal: Negative for nausea, diarrhea and constipation.  Genitourinary: Negative for urgency and frequency.  Skin: Negative for pallor or rash   Neurological: Negative for weakness, light-headedness, numbness and headaches.  Hematological: Negative for adenopathy. Does not bruise/bleed easily.  Psychiatric/Behavioral: Negative for dysphoric mood. The patient is not nervous/anxious.          Objective:   Physical Exam  Constitutional: She appears well-developed and well-nourished. No distress.       Obese and well appearing   HENT:  Head: Normocephalic and atraumatic.  Right Ear: External ear normal.  Left Ear: External ear normal.  Nose: Nose normal.  Mouth/Throat: Oropharynx is clear and moist.  Eyes: Conjunctivae and EOM are normal. Pupils are equal, round, and reactive to light. No scleral icterus.  Neck: Normal range of motion. Neck supple. No JVD present. Carotid bruit is not present. No thyromegaly present.  Cardiovascular: Normal rate, regular rhythm, normal heart sounds and intact distal pulses.  Exam reveals no gallop.   Pulmonary/Chest: Effort normal and breath  sounds normal. No respiratory distress. She has no wheezes.  Abdominal: Soft. Bowel sounds are normal. She exhibits no distension, no abdominal bruit  and no mass. There is no tenderness.  Genitourinary: Vagina normal and uterus normal. No breast swelling, tenderness, discharge or bleeding. No vaginal discharge found.  Musculoskeletal: Normal range of motion. She exhibits no edema and no tenderness.  Lymphadenopathy:    She has no cervical adenopathy.  Neurological: She is alert. She has normal reflexes. No cranial nerve deficit. Coordination normal.  Skin: Skin is warm and dry. No rash noted. No erythema. No pallor.  Psychiatric: She has a normal mood and affect.          Assessment & Plan:

## 2010-11-25 ENCOUNTER — Encounter: Payer: Self-pay | Admitting: *Deleted

## 2011-02-18 HISTORY — PX: HEMIARTHROPLASTY SHOULDER FRACTURE: SUR653

## 2011-04-27 ENCOUNTER — Other Ambulatory Visit: Payer: Self-pay | Admitting: Family Medicine

## 2011-04-29 NOTE — Telephone Encounter (Signed)
Refill did not go thru to pharmacy electronically. Refill called to pharmacy.

## 2011-09-26 ENCOUNTER — Encounter (HOSPITAL_COMMUNITY): Payer: Self-pay | Admitting: Emergency Medicine

## 2011-09-26 ENCOUNTER — Emergency Department (HOSPITAL_COMMUNITY): Payer: PRIVATE HEALTH INSURANCE

## 2011-09-26 ENCOUNTER — Emergency Department (HOSPITAL_COMMUNITY)
Admission: EM | Admit: 2011-09-26 | Discharge: 2011-09-26 | Disposition: A | Discharge status: Home / Self Care | Payer: PRIVATE HEALTH INSURANCE | Attending: Emergency Medicine | Admitting: Emergency Medicine

## 2011-09-26 DIAGNOSIS — W19XXXA Unspecified fall, initial encounter: Secondary | ICD-10-CM

## 2011-09-26 DIAGNOSIS — Z85828 Personal history of other malignant neoplasm of skin: Secondary | ICD-10-CM | POA: Insufficient documentation

## 2011-09-26 DIAGNOSIS — E785 Hyperlipidemia, unspecified: Secondary | ICD-10-CM | POA: Insufficient documentation

## 2011-09-26 DIAGNOSIS — Y93K1 Activity, walking an animal: Secondary | ICD-10-CM | POA: Insufficient documentation

## 2011-09-26 DIAGNOSIS — M810 Age-related osteoporosis without current pathological fracture: Secondary | ICD-10-CM | POA: Insufficient documentation

## 2011-09-26 DIAGNOSIS — E079 Disorder of thyroid, unspecified: Secondary | ICD-10-CM | POA: Insufficient documentation

## 2011-09-26 DIAGNOSIS — D649 Anemia, unspecified: Secondary | ICD-10-CM | POA: Insufficient documentation

## 2011-09-26 DIAGNOSIS — M129 Arthropathy, unspecified: Secondary | ICD-10-CM | POA: Insufficient documentation

## 2011-09-26 DIAGNOSIS — Y998 Other external cause status: Secondary | ICD-10-CM | POA: Insufficient documentation

## 2011-09-26 DIAGNOSIS — S42293A Other displaced fracture of upper end of unspecified humerus, initial encounter for closed fracture: Secondary | ICD-10-CM

## 2011-09-26 DIAGNOSIS — W108XXA Fall (on) (from) other stairs and steps, initial encounter: Secondary | ICD-10-CM | POA: Insufficient documentation

## 2011-09-26 DIAGNOSIS — IMO0002 Reserved for concepts with insufficient information to code with codable children: Secondary | ICD-10-CM | POA: Insufficient documentation

## 2011-09-26 DIAGNOSIS — K219 Gastro-esophageal reflux disease without esophagitis: Secondary | ICD-10-CM | POA: Insufficient documentation

## 2011-09-26 DIAGNOSIS — I10 Essential (primary) hypertension: Secondary | ICD-10-CM | POA: Insufficient documentation

## 2011-09-26 DIAGNOSIS — G4730 Sleep apnea, unspecified: Secondary | ICD-10-CM | POA: Insufficient documentation

## 2011-09-26 MED ORDER — OXYCODONE-ACETAMINOPHEN 5-325 MG PO TABS: 1.0000 | ORAL_TABLET | ORAL | Status: AC | PRN

## 2011-09-26 MED ORDER — ONDANSETRON 8 MG PO TBDP
8.0000 mg | ORAL_TABLET | Freq: Once | ORAL | Status: AC
  Administered 2011-09-26: 8 mg via ORAL
  Filled 2011-09-26: qty 1

## 2011-09-26 MED ORDER — HYDROMORPHONE HCL PF 1 MG/ML IJ SOLN
1.0000 mg | Freq: Once | INTRAMUSCULAR | Status: AC
  Administered 2011-09-26: 1 mg via INTRAMUSCULAR
  Filled 2011-09-26: qty 1

## 2011-09-26 NOTE — ED Provider Notes (Signed)
History     CSN: [id number]  Arrival date & time 09/26/11  1456   First MD Initiated Contact with Patient 09/26/11 1540      Chief Complaint  Patient presents with  . Fall  . Shoulder Pain    fell onto r/shoulder    (Consider location/radiation/quality/duration/timing/severity/associated sxs/prior treatment) HPI Comments: Patient reports she was walking with her dog, holding its collar when he took off ahead of her causing her to fall up the brick stairs.  Reports pain in her right shoulder.  Pain is worse with any movement of the shoulder.  States she did hit her face, but denies any pain in her face or head.  Denies neck or back pain, chest pain.  Denies weakness or numbness of the arms.  Denies malocclusion or any injury to her teeth.  Denies visual changes or difficulty moving her eyes.  Denies any other injury.    The history is provided by the patient.    Past Medical History  Diagnosis Date  . Anemia     NOS  . Cancer     hx of skin CA  . Arthritis   . DDD (degenerative disc disease)     of the spine (pain clinic)  . History of chicken pox   . GERD (gastroesophageal reflux disease)   . Heart murmur   . Hyperlipidemia   . Hypertension   . Colon polyp   . Hyperglycemia   . Acne   . Sleep apnea     wilth cpap  . Dry eye syndrome   . Osteoporosis   . Vitamin d deficiency   . Thyroid disease     thyroid problem thyroid nodule with neg bx in past    Past Surgical History  Procedure Date  . Tonsillectomy 1973  . Spinal cord stimulator implant   . Wisdom tooth extraction     Family History  Problem Relation Age of Onset  . Arthritis Mother   . Cancer Mother     thyroid CA  . Diabetes Mother   . Arthritis Father   . Heart disease Father   . Hyperlipidemia Father   . Hypertension Father   . Cancer Brother     thyroid CA  . Hypertension Brother   . Heart disease Brother     stent before age 21.  . Diabetes Maternal Grandmother   . Kidney disease  Maternal Grandmother     History  Substance Use Topics  . Smoking status: Never Smoker   . Smokeless tobacco: Not on file  . Alcohol Use: Yes    OB History    Grav Para Term Preterm Abortions TAB SAB Ect Mult Living                  Review of Systems  Eyes: Negative for visual disturbance.  Skin: Positive for wound.  Neurological: Negative for syncope, weakness, numbness and headaches.    Allergies  Lyrica  Home Medications   Current Outpatient Rx  Name Route Sig Dispense Refill  . CALCIUM CARBONATE ANTACID 750 MG PO CHEW Oral Chew 2 tablets by mouth daily.      Marland Kitchen VITAMIN D 1000 UNITS PO TABS Oral Take 1,000-2,000 Units by mouth daily.      Marland Kitchen CLINDAMYCIN-TRETINOIN 1.2-0.025 % EX GEL Topical Apply topically at bedtime.      . OMEPRAZOLE 20 MG PO CPDR Oral Take 20 mg by mouth daily.    Marland Kitchen SIMVASTATIN 40 MG PO TABS Oral  Take 40 mg by mouth every evening.    . SULFAMETHOXAZOLE-TRIMETHOPRIM 800-160 MG PO TABS Oral Take 1 tablet by mouth daily. For acne.    Marland Kitchen LIDOCAINE 5 % EX PTCH Transdermal Place 2 patches onto the skin every 14 (fourteen) days. As needed      BP 134/74  Pulse 98  Temp 97.8 F (36.6 C) (Oral)  Resp 18  SpO2 98%  Physical Exam  Nursing note and vitals reviewed. Constitutional: She appears well-developed and well-nourished. No distress.  HENT:  Head: Normocephalic. Head is with contusion.         No bony tenderness of face  Neck: Neck supple.  Pulmonary/Chest: Effort normal.  Musculoskeletal:       Right shoulder: She exhibits decreased range of motion, tenderness, bony tenderness and pain. She exhibits no crepitus, no deformity, normal pulse and normal strength.       Right hip: She exhibits no tenderness.       Left hip: She exhibits no tenderness.       Cervical back: She exhibits no bony tenderness.       Thoracic back: She exhibits no bony tenderness.       Lumbar back: She exhibits no bony tenderness.       Extremities nontender with  exception of right upper extremity.    All extremities:  Strength 5/5, sensation intact, distal pulses intact.    Neurological: She is alert.  Skin: She is not diaphoretic.       Abrasion to distal right great toe    ED Course  Procedures (including critical care time)  Labs Reviewed - No data to display Dg Shoulder Right  09/26/2011  *RADIOLOGY REPORT*  Clinical Data: Larey Seat and injured right shoulder.  Pain and limited mobility.  RIGHT SHOULDER - 2+ VIEW  Comparison: None.  Findings: Comminuted multi part fracture involving the humeral head and neck.  Joint effusion/hemarthrosis accounting for slight inferior subluxation of the humeral head.  Acromioclavicular joint intact without significant degenerative change.  No other fractures.  IMPRESSION: Comminuted multi part fracture involving the humeral head and neck.  Original Report Authenticated By: Arnell Sieving, M.D.   Dg Humerus Right  09/26/2011  *RADIOLOGY REPORT*  Clinical Data: Fall, shoulder pain  RIGHT HUMERUS - 2+ VIEW  Comparison: None.  Findings: Two views of the right humerus submitted.  There is comminuted mild displaced fracture of the right humeral head.  IMPRESSION: Comminuted mild displaced fracture of the right humeral head.  Original Report Authenticated By: Natasha Mead, M.D.   6:09 PM Reviewed xray with Dr Fredderick Phenix.  I also spoke with Dr Magnus Ivan (ortho) regarding patient and xray, he recommends sling and follow up early next week.    1. Fall   2. Humeral head fracture       MDM  Pt with accidental fall up the stairs after being pulled by her dog.  Pain in her right shoulder, found have have comminuted humeral head fracture.  Fracture discussed with orthopedist.  No other apparent bony injury.  Pt did hit her head but is not on blood thinners and has no pain in her head, face, or neck; nontender on exam, EOMs intact.  Neurovascularly intact.  Pt d/c home with percocet, ortho follow up, splint.  Pt does have abrasion of toe  - discussed tetanus vx - pt believes she is up to date, will check her records.  Declines tetanus vx here.  Abrasion cleaned and dressed by nurse.  Discussed  all results with patient.  Pt given return precautions.  Pt verbalizes understanding and agrees with plan.           [place], [place] 09/26/11 [phone]

## 2011-09-26 NOTE — ED Notes (Signed)
Swelling and bruising noted on r/eye. Pt was walking large dog,she fell when pulled. Denies LOC. Pt stated that r/eye struck corner of step..She fell onto r/hand. C/o hand and shoulder pain

## 2011-09-26 NOTE — ED Provider Notes (Signed)
Medical screening examination/treatment/procedure(s) were performed by non-physician practitioner and as supervising physician I was immediately available for consultation/collaboration.   Lailyn Appelbaum, MD 09/26/11 2315 

## 2011-12-13 ENCOUNTER — Other Ambulatory Visit: Payer: Self-pay | Admitting: Family Medicine

## 2011-12-15 ENCOUNTER — Other Ambulatory Visit: Payer: Self-pay

## 2011-12-15 NOTE — Telephone Encounter (Signed)
Pt requesting refills on simvastatin and omeprazole until CPX 04/2012. Advised pt refills were already sent to [facility].

## 2011-12-26 ENCOUNTER — Telehealth: Payer: Self-pay

## 2011-12-26 NOTE — Telephone Encounter (Signed)
I do not see it in my IN box

## 2011-12-26 NOTE — Telephone Encounter (Signed)
Synetta Fail with [facility] checking on status of faxed form for cpap machine. Synetta Fail request call back.

## 2011-12-26 NOTE — Telephone Encounter (Signed)
I reviewed the form - it requires a face to face evaluation completion date (she has not seen me since 10/12) as well as a copy of her sleep study She may end up having to follow up for them to accept it  Have her contact [facility] and find out In addition ask her when and where her last sleep study was I will hold on to the form

## 2011-12-26 NOTE — Telephone Encounter (Signed)
Spoke with Marcelino Duster with [facility], Synetta Fail was on lunch so she is going to have her re fax form once she gets back

## 2011-12-26 NOTE — Telephone Encounter (Signed)
Faxed received and placed in your In box

## 2011-12-29 ENCOUNTER — Encounter: Payer: Self-pay | Admitting: Family Medicine

## 2011-12-29 DIAGNOSIS — G4730 Sleep apnea, unspecified: Secondary | ICD-10-CM | POA: Insufficient documentation

## 2011-12-29 NOTE — Telephone Encounter (Signed)
Pt said that as long as she has had an OV within a yr that they would accept it, pt said when she requested the Cpap the 11/2010 OV would have been acceptable. Pt declined to make another appt. She wants you to fill out the form according to the information from the OV in 11/2010 and she is going to call [facility] and make sure that they will accept it because she doesn't want to come back for a f/u appt because she just had shoulder surgery, pt also said she has had only 1 sleep study and it was about 5 years ago when she was going to [facility] and they didn't forward her sleep study results when she transferred doctors office so we don't have a record of it and pt said she doesn't want to have another one, please advise

## 2011-12-29 NOTE — Telephone Encounter (Signed)
Done and in IN box I do not know what size mask to check off- ask pt and check that box please thanks

## 2011-12-29 NOTE — Telephone Encounter (Signed)
Got mask size from pt and faxed form

## 2011-12-29 NOTE — Telephone Encounter (Signed)
Left voicemail requesting pt to call office, will try to call back later 

## 2011-12-31 ENCOUNTER — Telehealth: Payer: Self-pay

## 2011-12-31 NOTE — Telephone Encounter (Signed)
Christa with [facility] got order for c pap but needs baseline titration study.Please advise.

## 2011-12-31 NOTE — Telephone Encounter (Signed)
I usually refer folks through our pulmonary doctors for sleep related tests - is that ok or do they have another suggestion ?

## 2012-01-01 NOTE — Telephone Encounter (Signed)
Called Cristal with [facility] and she said either we need an new sleep study or the original, pt has already stated that she doesn't want to have a new sleep study done. Left voicemail on pt's home # letting pt know we either need new sleep study done or get a copy of the original. Will try to call pt back later

## 2012-01-01 NOTE — Telephone Encounter (Signed)
Received released back from pt and faxed to [facility], waiting for them to fax back sleep study

## 2012-01-01 NOTE — Telephone Encounter (Signed)
Called [facility] and they do have pt's sleep study test on file, I advise pt and faxed a release to pt's fax # and she said she will fill it out and fax it back so we can get a copy of sleep study

## 2012-01-05 NOTE — Telephone Encounter (Signed)
Sleep Study received and faxed to [facility], no pulmonary referral needed

## 2012-01-06 ENCOUNTER — Encounter: Payer: Self-pay | Admitting: Cardiology

## 2012-04-14 ENCOUNTER — Encounter: Payer: Self-pay | Admitting: Cardiology

## 2012-04-28 ENCOUNTER — Telehealth: Payer: Self-pay | Admitting: Family Medicine

## 2012-04-28 DIAGNOSIS — M8580 Other specified disorders of bone density and structure, unspecified site: Secondary | ICD-10-CM

## 2012-04-28 NOTE — Telephone Encounter (Signed)
Message copied by Judy Pimple on Wed Apr 28, 2012  5:14 PM ------      Message from: Alvina Chou      Created: Tue Apr 20, 2012  2:44 PM      Regarding: Lab orders for Thursday, 3.13.14       Patient is scheduled for CPX labs, please order future labs, Thanks , Terri       ------

## 2012-04-29 ENCOUNTER — Other Ambulatory Visit (INDEPENDENT_AMBULATORY_CARE_PROVIDER_SITE_OTHER): Payer: PRIVATE HEALTH INSURANCE

## 2012-04-29 LAB — LIPID PANEL
Cholesterol: 179 mg/dL (ref 0–200)
Triglycerides: 115 mg/dL (ref 0.0–149.0)

## 2012-04-29 LAB — COMPREHENSIVE METABOLIC PANEL
Albumin: 4.2 g/dL (ref 3.5–5.2)
Alkaline Phosphatase: 62 U/L (ref 39–117)
BUN: 16 mg/dL (ref 6–23)
CO2: 26 mEq/L (ref 19–32)
Calcium: 9.9 mg/dL (ref 8.4–10.5)
GFR: 71.66 mL/min (ref >60.00)
Glucose, Bld: 117 mg/dL — ABNORMAL HIGH (ref 70–99)
Potassium: 4 mEq/L (ref 3.5–5.1)
Sodium: 139 mEq/L (ref 135–145)
Total Protein: 7.3 g/dL (ref 6.0–8.3)

## 2012-04-29 LAB — CBC WITH DIFFERENTIAL/PLATELET
Basophils Relative: 1 % (ref 0.0–3.0)
Eosinophils Relative: 4.2 % (ref 0.0–5.0)
HCT: 37.2 % (ref 36.0–46.0)
MCV: 86.3 fl (ref 78.0–100.0)
Monocytes Absolute: 0.7 10*3/uL (ref 0.1–1.0)
Monocytes Relative: 10.2 % (ref 3.0–12.0)
Neutrophils Relative %: 62.6 % (ref 43.0–77.0)
Platelets: 332 10*3/uL (ref 150.0–400.0)
RBC: 4.3 Mil/uL (ref 3.87–5.11)
WBC: 7 10*3/uL (ref 4.5–10.5)

## 2012-04-30 ENCOUNTER — Other Ambulatory Visit: Payer: PRIVATE HEALTH INSURANCE

## 2012-04-30 LAB — VITAMIN D 25 HYDROXY (VIT D DEFICIENCY, FRACTURES): Vit D, 25-Hydroxy: 59 ng/mL (ref 30–89)

## 2012-05-04 ENCOUNTER — Ambulatory Visit (INDEPENDENT_AMBULATORY_CARE_PROVIDER_SITE_OTHER): Payer: PRIVATE HEALTH INSURANCE | Admitting: Family Medicine

## 2012-05-04 ENCOUNTER — Encounter: Payer: Self-pay | Admitting: Family Medicine

## 2012-05-04 VITALS — BP 122/86 | HR 108 | Temp 98.9°F | Ht 61.5 in | Wt 201.8 lb

## 2012-05-04 DIAGNOSIS — E7800 Pure hypercholesterolemia, unspecified: Secondary | ICD-10-CM

## 2012-05-04 DIAGNOSIS — M899 Disorder of bone, unspecified: Secondary | ICD-10-CM

## 2012-05-04 MED ORDER — OMEPRAZOLE 20 MG PO CPDR: 20.0000 mg | DELAYED_RELEASE_CAPSULE | Freq: Every day | ORAL | Status: DC

## 2012-05-04 MED ORDER — SIMVASTATIN 40 MG PO TABS: 40.0000 mg | ORAL_TABLET | Freq: Every day | ORAL | Status: DC

## 2012-05-04 MED ORDER — ETODOLAC 500 MG PO TABS: 500.0000 mg | ORAL_TABLET | Freq: Two times a day (BID) | ORAL | Status: DC

## 2012-05-04 NOTE — Assessment & Plan Note (Signed)
Nl tsh  Pt has hx of thyroid nodules and is concerned about fullness in neck Will f/u to discuss further

## 2012-05-04 NOTE — Assessment & Plan Note (Signed)
Reviewed health habits including diet and exercise and skin cancer prevention Also reviewed health mt list, fam hx and immunizations  Rev wellness labs in detail Pt will schedule her own mamm  As for new issues mentioned-she will f/u with Dr Patsy Lager for knee and hip pain, and me for elevated HR and also hx of thyroid nodules

## 2012-05-04 NOTE — Assessment & Plan Note (Signed)
Per pt - her HR is chronically high even when she is relaxed Will return to discuss this further and work it up

## 2012-05-04 NOTE — Progress Notes (Signed)
Subjective:    Patient ID: Lori Lawrence, female    DOB: 1950/09/22, 62 y.o.   MRN: [id number]  HPI Here for health maintenance exam and to review chronic medical problems    Is doing ok overall  She had a fall (pulled down by her dog) - had to have shoulder surgery- doing better  Had her surgery at [facility] is down 8 lb with bmi of 37- working on it and getting back to exercise after inury now   Flu vaccine- got that in the fall   mammo 3/12 Self exam  colonsoc 1/10-? Recall is 5 years  Hx of polyps-no problems now   Pap 10/12 No hx of abn paps , and no gyn symptoms   Osteopenia Fall/fx-- did have fx this summer with bad fall (big trauma) dexa 3/12 was improved- due for 2 year  D level is 59- better  hyperglycmia No results found for this basename: HGBA1C   glucose 117- is working on low sugar diet and also weight loss   Hyperlipidemia  Lab Results  Component Value Date   CHOL 179 04/29/2012   CHOL 201* 11/13/2010   Lab Results  Component Value Date   HDL 56.30 04/29/2012   HDL 40.98 11/13/2010   Lab Results  Component Value Date   LDLCALC 100* 04/29/2012   Lab Results  Component Value Date   TRIG 115.0 04/29/2012   TRIG 188.0* 11/13/2010   Lab Results  Component Value Date   CHOLHDL 3 04/29/2012   CHOLHDL 3 11/13/2010   Lab Results  Component Value Date   LDLDIRECT 121.8 11/13/2010   on zocor and diet - very good control   Has a baseline pulse over 100 Noted this at [facility]  She had a thyroid nodule years ago - and it was not malignant- they treated her and it went away  Strong fam hx of thyroid cancer  Is interested in having her thyroid checked  Has not had a thyroid US for 10 years  Sometimes she notices some hoarseness      Review of Systems Review of Systems  Constitutional: Negative for fever, appetite change, fatigue and unexpected weight change.  Eyes: Negative for pain and visual disturbance.  ENT pos for fullness in neck at times - (felt  when she swallows) Respiratory: Negative for cough and shortness of breath.   Cardiovascular: Negative for cp or palpitations   pos for baseline rapid heart rate  Gastrointestinal: Negative for nausea, diarrhea and constipation.  Genitourinary: Negative for urgency and frequency.  Skin: Negative for pallor or rash   MSK pos for significant chronic hip and knee pain  Neurological: Negative for weakness, light-headedness, numbness and headaches.  Hematological: Negative for adenopathy. Does not bruise/bleed easily.  Psychiatric/Behavioral: Negative for dysphoric mood. The patient is not nervous/anxious.         Objective:   Physical Exam  Constitutional: She appears well-developed and well-nourished. No distress.  obese and well appearing   HENT:  Head: Normocephalic and atraumatic.  Right Ear: External ear normal.  Left Ear: External ear normal.  Nose: Nose normal.  Mouth/Throat: Oropharynx is clear and moist.  Eyes: Conjunctivae and EOM are normal. Pupils are equal, round, and reactive to light. No scleral icterus.  Neck: Normal range of motion. Neck supple. No JVD present. Carotid bruit is not present. Thyromegaly present.  Prominent thyroid nt  Cardiovascular: Regular rhythm, normal heart sounds and intact distal pulses.  Exam reveals no gallop.  Pulmonary/Chest: Effort normal and breath sounds normal. No respiratory distress. She has no wheezes. She has no rales.  Abdominal: Soft. Bowel sounds are normal. She exhibits no distension, no abdominal bruit and no mass. There is no tenderness.  Genitourinary: No breast swelling, tenderness, discharge or bleeding.  Breast exam: No mass, nodules, thickening, tenderness, bulging, retraction, inflamation, nipple discharge or skin changes noted.  No axillary or clavicular LA.  Chaperoned exam.    Musculoskeletal: Normal range of motion. She exhibits no edema and no tenderness.  Scar on R shoulder from recent surgery with excellent rom No  joint deformities  Lymphadenopathy:    She has no cervical adenopathy.  Neurological: She is alert. She has normal reflexes. No cranial nerve deficit. She exhibits normal muscle tone. Coordination normal.  Skin: Skin is warm and dry. No rash noted. No erythema. No pallor.  Solar lentigos diffusely   Psychiatric: She has a normal mood and affect.          Assessment & Plan:

## 2012-05-04 NOTE — Assessment & Plan Note (Signed)
Will schedule 2year dexa  Did have traumatic fx of shoulder this summer D level is better now

## 2012-05-04 NOTE — Assessment & Plan Note (Signed)
Disc goals for lipids and reasons to control them Rev labs with pt Rev low sat fat diet in detail  Some imp  Will continue simvastatin

## 2012-05-04 NOTE — Patient Instructions (Addendum)
Make an appt with Dr Patsy Lager for hip and knee pain  Shirlee Limerick will be calling you to set up your bone density test Don't forget to schedule your annual mammogram  Schedule follow up appt with me for elevated heart rate and thyroid concerns  Keep working on diet and exercise for weight loss

## 2012-05-07 ENCOUNTER — Encounter: Payer: PRIVATE HEALTH INSURANCE | Admitting: Family Medicine

## 2012-05-18 ENCOUNTER — Encounter: Payer: Self-pay | Admitting: Family Medicine

## 2012-06-02 ENCOUNTER — Ambulatory Visit (INDEPENDENT_AMBULATORY_CARE_PROVIDER_SITE_OTHER): Payer: PRIVATE HEALTH INSURANCE | Admitting: Family Medicine

## 2012-06-02 ENCOUNTER — Ambulatory Visit (INDEPENDENT_AMBULATORY_CARE_PROVIDER_SITE_OTHER)
Admission: RE | Admit: 2012-06-02 | Discharge: 2012-06-02 | Disposition: A | Discharge status: Home / Self Care | Payer: PRIVATE HEALTH INSURANCE | Source: Ambulatory Visit | Attending: Family Medicine | Admitting: Family Medicine

## 2012-06-02 ENCOUNTER — Ambulatory Visit: Payer: PRIVATE HEALTH INSURANCE | Admitting: Family Medicine

## 2012-06-02 ENCOUNTER — Encounter: Payer: Self-pay | Admitting: Family Medicine

## 2012-06-02 VITALS — BP 130/82 | HR 96 | Temp 98.3°F | Ht 61.5 in | Wt 199.5 lb

## 2012-06-02 DIAGNOSIS — M5137 Other intervertebral disc degeneration, lumbosacral region: Secondary | ICD-10-CM

## 2012-06-02 DIAGNOSIS — M5136 Other intervertebral disc degeneration, lumbar region: Secondary | ICD-10-CM

## 2012-06-02 DIAGNOSIS — M1710 Unilateral primary osteoarthritis, unspecified knee: Secondary | ICD-10-CM

## 2012-06-02 DIAGNOSIS — M25569 Pain in unspecified knee: Secondary | ICD-10-CM

## 2012-06-02 DIAGNOSIS — M25561 Pain in right knee: Secondary | ICD-10-CM

## 2012-06-02 DIAGNOSIS — M25562 Pain in left knee: Secondary | ICD-10-CM

## 2012-06-02 MED ORDER — DICLOFENAC SODIUM 1 % TD GEL: 4.0000 g | Freq: Four times a day (QID) | TRANSDERMAL | Status: DC

## 2012-06-02 MED ORDER — TRAMADOL HCL 50 MG PO TABS: 50.0000 mg | ORAL_TABLET | Freq: Four times a day (QID) | ORAL | Status: DC | PRN

## 2012-06-02 NOTE — Progress Notes (Signed)
Nature conservation officer at [facility] [address] [place] [address] Phone: [phone] Fax: [phone]  Date:  06/02/2012   Name:  Lori Lawrence   DOB:  Aug 02, 1950   MRN:  [id number] Gender: female Age: 62 y.o.  Primary Physician:  Roxy Manns, MD  Evaluating MD: Hannah Beat, MD   Chief Complaint: Hip Pain and Knee Pain   History of Present Illness:  Lori Lawrence is a 62 y.o. 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 Had a partial shoulder replacement in the fall on the right done by Dr. Joanette Gula at [facility] out - R knee is worse. L knee also. Occ buckling. No effusion. No locking up of her joint. No prior surgery or fracture surrounding the knee. Takes Lodine 3-4 times a week.   B posterior buttocks. No groin pain. C/o "hip" pain in area of B posterior buttocks.  Before shoulder - Dr. Joanette Gula, went to the gym. Swimming and walking.     Patient Active Problem List  Diagnosis  . COLONIC POLYPS  . UNSPECIFIED DISORDER OF THYROID  . HYPERCHOLESTEROLEMIA  . Osteopenia  . MURMUR  . ELEVATED BP READING WITHOUT DX HYPERTENSION  . SKIN CANCER, HX OF  . CHICKENPOX, HX OF  . Degenerative disc disease, lumbar  . Routine general medical examination at a health care facility  . Gynecological examination  . Sleep apnea  . Rapid heartbeat    Past Medical History  Diagnosis Date  . Anemia     NOS  . Cancer     hx of skin CA  . Arthritis   . DDD (degenerative disc disease)     of the spine (pain clinic)  . History of chicken pox   . GERD (gastroesophageal reflux disease)   . Heart murmur   . Hyperlipidemia   . Hypertension   . Colon polyp   . Hyperglycemia   . Acne   . Sleep apnea     wilth cpap  . Dry eye syndrome   . Osteoporosis   . Vitamin D deficiency   . Thyroid disease     thyroid problem thyroid nodule with neg bx  in past    Past Surgical History  Procedure Laterality Date  . Tonsillectomy  1973  . Spinal cord stimulator implant    . Wisdom tooth extraction      History   Social History  . Marital Status: Married    Spouse Name: N/A    Number of Children: N/A  . Years of Education: N/A   Occupational History  . Not on file.   Social History Main Topics  . Smoking status: Never Smoker   . Smokeless tobacco: Not on file  . Alcohol Use: Yes     Comment: occ  . Drug Use: No  . Sexually Active: Not on file   Other Topics Concern  . Not on file   Social History Narrative  . No narrative on file    Family History  Problem Relation Age of Onset  . Arthritis Mother   . Cancer Mother     thyroid CA  . Diabetes Mother   . Arthritis Father   . Heart disease Father   . Hyperlipidemia Father   . Hypertension Father   . Cancer Brother     thyroid CA  . Hypertension Brother   .  Heart disease Brother     stent before age 41.  . Diabetes Maternal Grandmother   . Kidney disease Maternal Grandmother     Allergies  Allergen Reactions  . Lyrica (Pregabalin)     sleepy  . Nitric Oxide (Nitrogen Oxide)     Medication list has been reviewed and updated.  Outpatient Prescriptions Prior to Visit  Medication Sig Dispense Refill  . ammonium lactate (AMLACTIN) 12 % cream Apply topically as needed for dry skin.      Marland Kitchen aspirin 81 MG tablet Take 81 mg by mouth daily.      . calcium carbonate (TUMS CALCIUM FOR LIFE BONE) 750 MG chewable tablet Chew 2 tablets by mouth daily.        . cholecalciferol (VITAMIN D) 1000 UNITS tablet Take 1,000-2,000 Units by mouth daily.        . clindamycin-tretinoin (ZIANA) gel Apply topically at bedtime.        . diphenhydramine-acetaminophen (TYLENOL PM) 25-500 MG TABS Take 1 tablet by mouth at bedtime as needed.      . etodolac (LODINE) 500 MG tablet Take 1 tablet (500 mg total) by mouth 2 (two) times daily.  60 tablet  11  . lidocaine (LIDODERM) 5 %  Place 2 patches onto the skin every 14 (fourteen) days. As needed      . omeprazole (PRILOSEC) 20 MG capsule Take 1 capsule (20 mg total) by mouth daily.  60 capsule  11  . simvastatin (ZOCOR) 40 MG tablet Take 1 tablet (40 mg total) by mouth at bedtime.  30 tablet  11  . sulfamethoxazole-trimethoprim (BACTRIM DS,SEPTRA DS) 800-160 MG per tablet Take 1 tablet by mouth daily. For acne.       No facility-administered medications prior to visit.    Review of Systems:   GEN: No fevers, chills. Nontoxic. Primarily MSK c/o today. MSK: Detailed in the HPI GI: tolerating PO intake without difficulty Neuro: No numbness, parasthesias, or tingling associated. Otherwise the pertinent positives of the ROS are noted above.    Physical Examination: BP 130/82  Pulse 96  Temp(Src) 98.3 F (36.8 C) (Oral)  Ht 5' 1.5" (1.562 m)  Wt 199 lb 8 oz (90.493 kg)  BMI 37.09 kg/m2  SpO2 98%  Ideal Body Weight: Weight in (lb) to have BMI = 25: 134.2   GEN: WDWN, NAD, Non-toxic, Alert & Oriented x 3 HEENT: Atraumatic, Normocephalic.  Ears and Nose: No external deformity. EXTR: No clubbing/cyanosis/edema NEURO: Normal gait.  PSYCH: Normally interactive. Conversant. Not depressed or anxious appearing.  Calm demeanor.   Knee:  b Gait: Normal heel toe pattern ROM: 0-125 Effusion: neg Echymosis or edema: none Patellar tendon NT Painful PLICA: neg Patellar grind: negative Medial and lateral patellar facet loading: negative medial and lateral joint lines:MEDIAL JOINT LINES TTP Mcmurray's neg Flexion-pinch PAINFUL ON THE RIGHT Varus and valgus stress: stable Lachman: neg Ant and Post drawer: neg Hip abduction, IR, ER: WNL Hip flexion str: 5/5 Hip abd: 5/5 Quad: 5/5 VMO atrophy:No Hamstring concentric and eccentric: 5/5   HIP EXAM: SIDE: B ROM: Abduction, Flexion, Internal and External range of motion: full Pain with terminal IROM and EROM: No GTB: NT SLR: NEG Knees: No effusion FABER:  NT REVERSE FABER: NT, neg Piriformis: NT at direct palpation Mild tenderness on posterior pelvic rim Str: flexion: 5/5 abduction: 5/5 adduction: 5/5 Strength testing non-tender   Dg Knee Ap/lat W/sunrise Left  06/02/2012  *RADIOLOGY REPORT*  Clinical Data: Left knee pain  DG  KNEE - 3 VIEWS  Comparison: None.  Findings: Five views of the left knee submitted.  No acute fracture or subluxation.  Mild narrowing of medial joint compartment.  Mild spurring of medial tibial plateau and medial femoral condyle. Small joint effusion.  Mild spurring of patella.  IMPRESSION: No acute fracture or subluxation.  Mild degenerative changes as described above.   Original Report Authenticated By: Natasha Mead, M.D.    Dg Knee Ap/lat W/sunrise Right  06/02/2012  *RADIOLOGY REPORT*  Clinical Data: Knee pain bilateral  DG KNEE - 3 VIEWS  Comparison: None.  Findings: Negative for fracture.  Mild to spurring in the lateral joint compartment.  Minimal patellofemoral degenerative change. Medial compartment is normal.  There is a joint effusion.  Negative for fracture.  IMPRESSION: Knee joint effusion with mild degenerative change.   Original Report Authenticated By: Janeece Riggers, M.D.     Assessment and Plan:  Knee pain, bilateral - Plan: DG Knee AP/LAT W/Sunrise Right, DG Knee AP/LAT W/Sunrise Left  Degenerative disc disease, lumbar  Osteoarthritis, knee  Mild OA clinically and on radiographs. Independently reviewed. No intraarticular hip pain - posterior buttocks pain is referred from her back.  Continue with weight loss, swimming as much as she is able. NSAIDS scheduled ok since on PPI. Tylenol up to QID Tramadol prn Voltaren gel or Capzaicin prn  F/u with me prn  Orders Today:  Orders Placed This Encounter  Procedures  . DG Knee AP/LAT W/Sunrise Right    Standing Status: Future     Number of Occurrences: 1     Standing Expiration Date: 08/02/2013    Order Specific Question:  Reason for exam:    Answer:   knee pain    Order Specific Question:  Is the patient pregnant?    Answer:  No    Order Specific Question:  Preferred imaging location?    Answer:  [facility]  . DG Knee AP/LAT W/Sunrise Left    Standing Status: Future     Number of Occurrences: 1     Standing Expiration Date: 08/02/2013    Order Specific Question:  Reason for exam:    Answer:  knee pain    Order Specific Question:  Is the patient pregnant?    Answer:  No    Order Specific Question:  Preferred imaging location?    Answer:  [facility]    Updated Medication List: (Includes new medications, updates to list, dose adjustments) Meds ordered this encounter  Medications  . traMADol (ULTRAM) 50 MG tablet    Sig: Take 1 tablet (50 mg total) by mouth every 6 (six) hours as needed for pain.    Dispense:  50 tablet    Refill:  2  . diclofenac sodium (VOLTAREN) 1 % GEL    Sig: Apply 4 g topically 4 (four) times daily.    Dispense:  5 Tube    Refill:  11    Medications Discontinued: There are no discontinued medications.    Signed, Elpidio Galea. Ricca Melgarejo, MD 06/02/2012 9:07 AM

## 2012-06-02 NOTE — Patient Instructions (Signed)
OSTEOARTHRITIS:  For symptomatic relief: Tylenol: 2 tablets up to 3-4 times a day Regular NSAIDS are helpful (avoid in kidney disease and ulcers) Topical Capzaicin Cream, as needed (wear glove to put on) Topical Voltaren (NSAID) Gel can help   For flares, corticosteroid injections help. Hyaluronic Acid injections have good success, average relief is 6 months  REGULAR EXERCISE: swimming, Yoga, Tai Chi, bicycle (NON-IMPACT activity)

## 2012-06-04 ENCOUNTER — Encounter: Payer: Self-pay | Admitting: Family Medicine

## 2012-06-04 ENCOUNTER — Ambulatory Visit
Admission: RE | Admit: 2012-06-04 | Discharge: 2012-06-04 | Disposition: A | Discharge status: Home / Self Care | Payer: PRIVATE HEALTH INSURANCE | Source: Ambulatory Visit | Attending: Family Medicine | Admitting: Family Medicine

## 2012-06-04 ENCOUNTER — Ambulatory Visit (INDEPENDENT_AMBULATORY_CARE_PROVIDER_SITE_OTHER): Payer: PRIVATE HEALTH INSURANCE | Admitting: Family Medicine

## 2012-06-04 VITALS — BP 124/78 | HR 97 | Temp 98.2°F | Ht 61.75 in | Wt 201.0 lb

## 2012-06-04 DIAGNOSIS — E049 Nontoxic goiter, unspecified: Secondary | ICD-10-CM

## 2012-06-04 DIAGNOSIS — R000 Tachycardia, unspecified: Secondary | ICD-10-CM | POA: Insufficient documentation

## 2012-06-04 NOTE — Progress Notes (Signed)
Subjective:    Patient ID: Lori Lawrence, female    DOB: Dec 30, 1950, 62 y.o.   MRN: [id number]  HPI Here for thyroid issues and rapid HR  In 2004 - she had a thyroid nodule (palpable)- [facility]- had a radioactive thyroid scan It came back positive (told her it may be malignant) Than needle bx was normal - relief She then took ? Levothyroxine and the nodule went away  Felt better and lost weight  Now is more tired and trouble loosing weight  Neck feels like it is getting bigger/ lumpy and sometimes has difficulty swallowing  Lab Results  Component Value Date   TSH 3.93 04/29/2012     Mother and brother both had thyroid cancer   Fast heart rate -going on for years When she exercises-takes no time to get that up to 135 Usually her HR is usually 95-105 at home  Not nervous Does drink a lot of caffeine No cp or sob  Patient Active Problem List  Diagnosis  . COLONIC POLYPS  . UNSPECIFIED DISORDER OF THYROID  . HYPERCHOLESTEROLEMIA  . Osteopenia  . MURMUR  . ELEVATED BP READING WITHOUT DX HYPERTENSION  . SKIN CANCER, HX OF  . CHICKENPOX, HX OF  . Degenerative disc disease, lumbar  . Routine general medical examination at a health care facility  . Gynecological examination  . Sleep apnea  . Rapid heart rate  . Goiter   Past Medical History  Diagnosis Date  . Anemia     NOS  . Cancer     hx of skin CA  . Arthritis   . DDD (degenerative disc disease)     of the spine (pain clinic)  . History of chicken pox   . GERD (gastroesophageal reflux disease)   . Heart murmur   . Hyperlipidemia   . Hypertension   . Colon polyp   . Hyperglycemia   . Acne   . Sleep apnea     wilth cpap  . Dry eye syndrome   . Osteoporosis   . Vitamin D deficiency   . Thyroid disease     thyroid problem thyroid nodule with neg bx in past   Past Surgical History  Procedure Laterality Date  . Tonsillectomy  1973  . Spinal cord stimulator implant    . Wisdom tooth  extraction    . Hemiarthroplasty shoulder fracture Right 2013    Dr. Wendee Copp at [facility]   History  Substance Use Topics  . Smoking status: Never Smoker   . Smokeless tobacco: Not on file  . Alcohol Use: Yes     Comment: occ   Family History  Problem Relation Age of Onset  . Arthritis Mother   . Cancer Mother     thyroid CA  . Diabetes Mother   . Arthritis Father   . Heart disease Father   . Hyperlipidemia Father   . Hypertension Father   . Cancer Brother     thyroid CA  . Hypertension Brother   . Heart disease Brother     stent before age 51.  . Diabetes Maternal Grandmother   . Kidney disease Maternal Grandmother    Allergies  Allergen Reactions  . Lyrica (Pregabalin)     sleepy  . Nitric Oxide (Nitrogen Oxide)    Current Outpatient Prescriptions on File Prior to Visit  Medication Sig Dispense Refill  . ammonium lactate (AMLACTIN) 12 % cream Apply topically as needed for dry skin.      Marland Kitchen  aspirin 81 MG tablet Take 81 mg by mouth daily.      . calcium carbonate (TUMS CALCIUM FOR LIFE BONE) 750 MG chewable tablet Chew 2 tablets by mouth daily.        . cholecalciferol (VITAMIN D) 1000 UNITS tablet Take 1,000-2,000 Units by mouth daily.        . clindamycin-tretinoin (ZIANA) gel Apply topically at bedtime.        . diclofenac sodium (VOLTAREN) 1 % GEL Apply 4 g topically 4 (four) times daily.  5 Tube  11  . diphenhydramine-acetaminophen (TYLENOL PM) 25-500 MG TABS Take 1 tablet by mouth at bedtime as needed.      . etodolac (LODINE) 500 MG tablet Take 1 tablet (500 mg total) by mouth 2 (two) times daily.  60 tablet  11  . lidocaine (LIDODERM) 5 % Place 2 patches onto the skin every 14 (fourteen) days. As needed      . omeprazole (PRILOSEC) 20 MG capsule Take 1 capsule (20 mg total) by mouth daily.  60 capsule  11  . simvastatin (ZOCOR) 40 MG tablet Take 1 tablet (40 mg total) by mouth at bedtime.  30 tablet  11  . sulfamethoxazole-trimethoprim (BACTRIM DS,SEPTRA DS)  800-160 MG per tablet Take 1 tablet by mouth daily. For acne.      . traMADol (ULTRAM) 50 MG tablet Take 1 tablet (50 mg total) by mouth every 6 (six) hours as needed for pain.  50 tablet  2   No current facility-administered medications on file prior to visit.    Review of Systems Review of Systems  Constitutional: Negative for fever, appetite change,  and unexpected weight change. pos for inability to loose wt Eyes: Negative for pain and visual disturbance.  Respiratory: Negative for cough and shortness of breath.   Cardiovascular: Negative for cp or irregular hr , pos for high HR   Gastrointestinal: Negative for nausea, diarrhea and constipation.  Genitourinary: Negative for urgency and frequency.  Skin: Negative for pallor or rash   Neurological: Negative for weakness, light-headedness, numbness and headaches.  Hematological: Negative for adenopathy. Does not bruise/bleed easily.  Psychiatric/Behavioral: Negative for dysphoric mood. The patient is not nervous/anxious.         Objective:   Physical Exam  Constitutional: She appears well-developed and well-nourished. No distress.  obese and well appearing   HENT:  Head: Normocephalic and atraumatic.  Mouth/Throat: Oropharynx is clear and moist.  Eyes: Conjunctivae and EOM are normal. Pupils are equal, round, and reactive to light. No scleral icterus.  Neck: Normal range of motion. Neck supple. No JVD present. Carotid bruit is not present. No tracheal deviation present. Thyromegaly present.  Thyroid feels enlarged on left-non tender and no thyroid bruit  Cardiovascular: Normal rate and intact distal pulses.  Exam reveals no gallop.   Pulmonary/Chest: Effort normal and breath sounds normal.  Abdominal: She exhibits no distension and no mass. There is no tenderness.  Musculoskeletal: She exhibits no edema.  Lymphadenopathy:    She has no cervical adenopathy.  Neurological: She is alert. She has normal reflexes. She displays no  tremor.  Skin: Skin is warm. No rash noted. No erythema. No pallor.  Psychiatric: She has a normal mood and affect.          Assessment & Plan:

## 2012-06-04 NOTE — Assessment & Plan Note (Signed)
With hx of b9 thyroid nodule and then thyroid repl in 2004 Symptoms are re occuring incl fatigue and neck enl/ trouble swallowing Asymmetric thyroid today-feels enl on L  Ref for ultrasound

## 2012-06-04 NOTE — Assessment & Plan Note (Signed)
Rate in low 90s - 89 on EKG with no acute changes (reassuring)  I suspect her high HR is due to caffeine (she drinks 12 cups of coffee per day) Disc physiologic eff of caffeine Will gradually transition to only decaf beverages in 12 weeks - and then report back  If no imp in high resting HR-will ref to cardiology

## 2012-06-04 NOTE — Addendum Note (Signed)
Addended by: Roxy Manns A on: 06/04/2012 12:53 PM   Modules accepted: Orders

## 2012-06-04 NOTE — Patient Instructions (Addendum)
See Shirlee Limerick on the way out for thyroid ultrasound referral Stop caffeine (cut it down by one serving per day until none)- to see if this reduces heart rate By week 12 you should be caffeine free- drink only decaf coffee/tea or soda If at that time your high heart rate persists-let me know and we will refer you for cardiology eval  Keep exercising

## 2012-06-07 ENCOUNTER — Encounter: Payer: Self-pay | Admitting: *Deleted

## 2012-06-09 ENCOUNTER — Ambulatory Visit: Payer: PRIVATE HEALTH INSURANCE | Admitting: Family Medicine

## 2012-06-15 LAB — HM DEXA SCAN

## 2012-06-17 ENCOUNTER — Encounter: Payer: Self-pay | Admitting: Family Medicine

## 2012-06-18 ENCOUNTER — Encounter: Payer: Self-pay | Admitting: Family Medicine

## 2012-06-18 ENCOUNTER — Encounter: Payer: Self-pay | Admitting: *Deleted

## 2012-06-21 ENCOUNTER — Encounter: Payer: Self-pay | Admitting: *Deleted

## 2012-06-21 ENCOUNTER — Encounter: Payer: Self-pay | Admitting: Family Medicine

## 2012-06-22 ENCOUNTER — Telehealth: Payer: Self-pay

## 2012-06-22 NOTE — Telephone Encounter (Signed)
Pt left v/m; had bone density 06/15/12 and was told would get results in few days.Please advise.

## 2012-06-22 NOTE — Telephone Encounter (Signed)
Pt notified of DEXA results and advise that we mailed letter

## 2012-06-25 ENCOUNTER — Ambulatory Visit (INDEPENDENT_AMBULATORY_CARE_PROVIDER_SITE_OTHER): Payer: PRIVATE HEALTH INSURANCE | Admitting: Internal Medicine

## 2012-06-25 ENCOUNTER — Encounter: Payer: Self-pay | Admitting: Internal Medicine

## 2012-06-25 VITALS — BP 124/88 | HR 107 | Temp 98.3°F | Resp 10

## 2012-06-25 DIAGNOSIS — E042 Nontoxic multinodular goiter: Secondary | ICD-10-CM

## 2012-06-25 DIAGNOSIS — E041 Nontoxic single thyroid nodule: Secondary | ICD-10-CM

## 2012-06-25 NOTE — Progress Notes (Signed)
Subjective:     Patient ID: Lori Lawrence, female   DOB: 07/29/1950, 62 y.o.   MRN: [id number]  HPI Lori Lawrence is a pleasant 62 y/o referred by her PCP, Dr. Milinda Antis, for management of MNG and a left thyroid nodule.  The patient describes that the nodule was felt on palpation during a visit at [facility] in 2004. She was sent for a thyroid uptake and scan at that time, and this showed a photopenic defect in the left thyroid. A thyroid ultrasound show the left thyroid nodule, which was subsequently biopsied, and this was benign. She does remember that she had some neck compression symptoms at that time. She was then started on thyroid hormone started to shrink the nodule, which she took for 2 years. After this period, due to the fact that the nodule was not palpable anymore, she was taken off the thyroid hormone.   She started to develop again choking, dysphagia, hoarseness - cannot remember exactly when she started to feel these symptoms, in the last years probably. These are not incapacitating for her, she mentions. Of note, she also has OSA and uses a CPAP. Patient saw Dr. Milinda Antis recently, and a new thyroid ultrasound from 06/07/2012 showed an enlarged thyroid, with multiple small right lobe nodules, no larger than 1.2 cm, but also a very large complex nodule in the left lobe, measuring 4.3 x 2.6 x 2.8 cm. This has questionable microcalcifications versus colloid granules, I could not tell by the images. We reviewed the images together with the patient.  Other symptoms: she feels fatigued, not rested in am; gained a lot of weight: >50 lbs in last 3 years, then lost some of them. She has oily skin, not as bad lately. She has been on Bactrim for 10 years for acne. Hair is less greasy in the last years. She stays hot all the time - mostly in last few years. No constipation. No tremors and palpitations.   TSH levels were reviewed: Lab Results  Component Value Date   TSH 3.93 04/29/2012   TSH  3.09 11/13/2010   The patient has a family history of thyroid cancer in her 54 year younger brother and he her mother. Her mother had thyroidectomy 30 years ago and has no problems from her thyroid cancer at that time. Her brother recently had thyroidectomy.  Review of Systems Constitutional: + weight gain, + fatigue, + subjective hyperthermia, + increased appetite Eyes: no blurry vision, no xerophthalmia ENT: no sore throat, + nodules palpated in throat, + dysphagia/no odynophagia, no hoarseness Cardiovascular: no CP/SOB/palpitations/leg swelling Respiratory: no cough/+SOB Gastrointestinal: no N/V/D/C Musculoskeletal: + muscle/+ joint aches Skin: no rashes;  +acne Neurological: no tremors/numbness/tingling/dizziness Psychiatric: no depression/anxiety  Past Medical History  Diagnosis Date  . Anemia     NOS  . Cancer     hx of skin CA  . Arthritis   . DDD (degenerative disc disease)     of the spine (pain clinic)  . History of chicken pox   . GERD (gastroesophageal reflux disease)   . Heart murmur   . Hyperlipidemia   . Hypertension   . Colon polyp   . Hyperglycemia   . Acne   . Sleep apnea     wilth cpap  . Dry eye syndrome   . Osteoporosis   . Vitamin D deficiency   . Thyroid disease     thyroid problem thyroid nodule with neg bx in past   Past Surgical History  Procedure  Laterality Date  . Tonsillectomy  1973  . Spinal cord stimulator implant    . Wisdom tooth extraction    . Hemiarthroplasty shoulder fracture Right 2013    Dr. Wendee Copp at [facility]   History   Social History  . Marital Status: Married    Spouse Name: N/A    Number of Children: 2:  37 and 65years old   Occupational History  . [employer]   Social History Main Topics  . Smoking status: Never Smoker   . Alcohol Use: Yes     Comment: occ - wine or beer 1-2 drinks 2-3 times weekly  . Drug Use: No   Current Outpatient Prescriptions on File Prior to Visit  Medication Sig Dispense  Refill  . ammonium lactate (AMLACTIN) 12 % cream Apply topically as needed for dry skin.      Marland Kitchen aspirin 81 MG tablet Take 81 mg by mouth daily.      . calcium carbonate (TUMS CALCIUM FOR LIFE BONE) 750 MG chewable tablet Chew 2 tablets by mouth daily.        . cholecalciferol (VITAMIN D) 1000 UNITS tablet Take 1,000-2,000 Units by mouth daily.        . clindamycin-tretinoin (ZIANA) gel Apply topically at bedtime.        . diclofenac sodium (VOLTAREN) 1 % GEL Apply 4 g topically 4 (four) times daily.  5 Tube  11  . diphenhydramine-acetaminophen (TYLENOL PM) 25-500 MG TABS Take 1 tablet by mouth at bedtime as needed.      . etodolac (LODINE) 500 MG tablet Take 1 tablet (500 mg total) by mouth 2 (two) times daily.  60 tablet  11  . lidocaine (LIDODERM) 5 % Place 2 patches onto the skin every 14 (fourteen) days. As needed      . omeprazole (PRILOSEC) 20 MG capsule Take 1 capsule (20 mg total) by mouth daily.  60 capsule  11  . simvastatin (ZOCOR) 40 MG tablet Take 1 tablet (40 mg total) by mouth at bedtime.  30 tablet  11  . sulfamethoxazole-trimethoprim (BACTRIM DS,SEPTRA DS) 800-160 MG per tablet Take 1 tablet by mouth daily. For acne.      . traMADol (ULTRAM) 50 MG tablet Take 1 tablet (50 mg total) by mouth every 6 (six) hours as needed for pain.  50 tablet  2   No current facility-administered medications on file prior to visit.   Allergies  Allergen Reactions  . Lyrica (Pregabalin)     sleepy  . Nitric Oxide (Nitrogen Oxide)    Family History  Problem Relation Age of Onset  . Arthritis Mother   . Cancer Mother     thyroid CA  . Diabetes Mother   . Arthritis Father   . Heart disease Father   . Hyperlipidemia Father   . Hypertension Father   . Cancer Brother     thyroid CA  . Hypertension Brother   . Heart disease Brother     stent before age 70.  . Diabetes Maternal Grandmother   . Kidney disease Maternal Grandmother    Objective:   Physical Exam BP 124/88  Pulse 107   Temp(Src) 98.3 F (36.8 C) (Oral)  Resp 10  SpO2 98% Wt Readings from Last 3 Encounters:  06/04/12 201 lb (91.173 kg)  06/02/12 199 lb 8 oz (90.493 kg)  05/04/12 201 lb 12 oz (91.513 kg)   Constitutional: overweight, in NAD Eyes: PERRLA, EOMI, no exophthalmos ENT: moist mucous membranes, thyromegaly  L>R, no cervical lymphadenopathy Cardiovascular: tachycardia RR, No MRG Respiratory: CTA B Gastrointestinal: abdomen soft, NT, ND, BS+ Musculoskeletal: no deformities, strength intact in all 4 Skin: moist, warm, no rashes Neurological: no tremor with outstretched hands, DTR normal in all 4  Assessment:     1. MNG - Mild neck compression symptoms  2. Large left thyroid nodule - Concerning due to the size, questionable microcalcifications, and family history of thyroid cancer in brother and mother  - previous thyroid ultrasound at [facility] 11/01/2002 - nuclear medicine scan 10/26/2002: cold nodule in the left lobe - thyroid ultrasound 06/07/2012: The echogenicity of the thyroid parenchyma is  inhomogeneous.  Right lobe: 5.0 x 1.6 x 2.2 cm. Nodules on the right are noted of no more than 1.2 cm in diameter.   Left lobe: 5.3 x 2.9 x 3.2 cm. Much of the left lobe is occupied by a complex nodule 4.3 x 2.6 x 2.8 cm.   Isthmus: 4.5 mm in thickness.   Lymphadenopathy: None visualized.  - last TSH 3.93 on 04/29/2012 - TSH 3.09 on 11/13/2010  Plan:     1. MNG -  I reviewed the images to the most recent thyroid ultrasound along with the patient, and I did point out the inhomogeneous aspect of her thyroid, which is enlarged.  - She has some neck compression symptoms, but upon questioning, these are not very severe  2. Left thyroid nodule - This is problematic especially due to the size and her family history of thyroid cancer in 2 family members - she had a benign biopsy 10 years ago, neck in clinic to obtain another one, especially since there is indication that the  nodule has grown - The patient agrees with the plan and I have ordered the new FNA - We discussed about the plan if this returns malignant >> total thyroidectomy. If this returns benign, we should most likely follow her and see if her neck compression symptoms worsen, and refer her for left thyroidectomy if this happens. - I will see the patient back in a year, and at that time, if her nodule has grown, we might need to repeat the ultrasound, however if it has not grown and she does not have increased neck compression symptoms, I would wait another year to repeat the ultrasound. - I advised her to join my chart notes and the results through there  Adequacy Reason Satisfactory For Evaluation. Diagnosis THYROID, FINE NEEDLE ASPIRATION, LEFT: BENIGN. FINDINGS CONSISTENT WITH NON-NEOPLASTIC GOITER. Zandra Abts MD Pathologist, Electronic Signature (Case signed 07/08/2012)

## 2012-06-25 NOTE — Patient Instructions (Signed)
Please return in 1 year. We will call you with the FNA appointment. Let me know if you are not called in next 5 days. Please join MyChart. I will send you the results and further plan through MyChart.

## 2012-07-07 ENCOUNTER — Encounter: Payer: Self-pay | Admitting: Internal Medicine

## 2012-07-07 ENCOUNTER — Other Ambulatory Visit (HOSPITAL_COMMUNITY)
Admission: RE | Admit: 2012-07-07 | Discharge: 2012-07-07 | Disposition: A | Discharge status: Home / Self Care | Payer: PRIVATE HEALTH INSURANCE | Source: Ambulatory Visit | Attending: Interventional Radiology | Admitting: Interventional Radiology

## 2012-07-07 ENCOUNTER — Ambulatory Visit
Admission: RE | Admit: 2012-07-07 | Discharge: 2012-07-07 | Disposition: A | Discharge status: Home / Self Care | Payer: PRIVATE HEALTH INSURANCE | Source: Ambulatory Visit | Attending: Internal Medicine | Admitting: Internal Medicine

## 2012-07-07 DIAGNOSIS — E041 Nontoxic single thyroid nodule: Secondary | ICD-10-CM

## 2012-07-07 DIAGNOSIS — E049 Nontoxic goiter, unspecified: Secondary | ICD-10-CM | POA: Insufficient documentation

## 2012-07-13 ENCOUNTER — Encounter: Payer: Self-pay | Admitting: Internal Medicine

## 2012-09-13 ENCOUNTER — Other Ambulatory Visit: Payer: Self-pay | Admitting: Family Medicine

## 2012-09-14 NOTE — Telephone Encounter (Signed)
Rx called in as prescribed 

## 2012-09-14 NOTE — Telephone Encounter (Signed)
Px written for call in   

## 2012-09-14 NOTE — Telephone Encounter (Signed)
Last filled 06/02/12.

## 2012-09-30 ENCOUNTER — Telehealth: Payer: Self-pay | Admitting: Family Medicine

## 2012-09-30 NOTE — Telephone Encounter (Signed)
Pt notified Dr. Milinda Antis is okay with CPE in March

## 2012-09-30 NOTE — Telephone Encounter (Signed)
That sounds ok

## 2012-09-30 NOTE — Telephone Encounter (Signed)
Patient was seen in April for a rapid heartbeat.  She stopped caffeine and she hasn't had any problems.  I scheduled her for a physical with you next March.  Patient wants to know if you want to see her before March,2015.  Please advise.

## 2012-11-09 ENCOUNTER — Other Ambulatory Visit: Payer: Self-pay

## 2012-11-09 MED ORDER — LIDOCAINE 5 % EX PTCH: 2.0000 | MEDICATED_PATCH | CUTANEOUS | Status: DC

## 2012-11-09 NOTE — Telephone Encounter (Signed)
Patient notified as instructed by telephone.  Patient is aware to leave patch on for 12 hours and then off for 12 hours before placing new patch.

## 2012-11-09 NOTE — Telephone Encounter (Signed)
Pt left v/m; saw Dr Patsy Lager 06/02/12 for knee and hip pain; pt said Dr Patsy Lager told pt OK to continue using Lidoderm patch when needed. Pt request refill Lidoderm patch to [facility]. Pt request cb when refill done.

## 2012-11-09 NOTE — Telephone Encounter (Signed)
Would you check with the patient and make sure she understands how to use them.  The sig they sent back to me said "leave on for 14 days" which is not right at all. You can leave on 12 hours, but you have to take off 12 hours also.  Thanks!

## 2012-11-16 ENCOUNTER — Other Ambulatory Visit: Payer: Self-pay | Admitting: Family Medicine

## 2012-11-16 NOTE — Telephone Encounter (Signed)
Px written for call in   

## 2012-11-16 NOTE — Telephone Encounter (Signed)
Electronic refill request, please advise  

## 2012-11-17 NOTE — Telephone Encounter (Signed)
Rx called in as prescribed 

## 2012-12-08 ENCOUNTER — Other Ambulatory Visit: Payer: Self-pay | Admitting: Physician Assistant

## 2012-12-09 ENCOUNTER — Other Ambulatory Visit: Payer: Self-pay | Admitting: Family Medicine

## 2012-12-09 NOTE — Telephone Encounter (Signed)
Please ask her on average now many of these she needs per day or week- so I know how many to give, and ask how her pain is also -thanks

## 2012-12-09 NOTE — Telephone Encounter (Signed)
Electronic refill request, please advise  

## 2012-12-10 ENCOUNTER — Encounter: Payer: Self-pay | Admitting: Family Medicine

## 2012-12-10 DIAGNOSIS — M170 Bilateral primary osteoarthritis of knee: Secondary | ICD-10-CM | POA: Insufficient documentation

## 2012-12-10 NOTE — Telephone Encounter (Signed)
Medication phoned to pharmacy.  

## 2012-12-10 NOTE — Telephone Encounter (Signed)
Px written for call in   

## 2012-12-10 NOTE — Telephone Encounter (Signed)
Pt said on average she takes 1-2 tabs a day. Pt said her pain is about the same her right hip and knee are still bothering her especially the days she is having to stand and be on her feet a lot, but she is still also using the voltaren gel too, please advise, pt doesn't request call back she will just check with pharmacy to see when Rx ready for pick up, unless you direct her to do something else

## 2012-12-23 ENCOUNTER — Other Ambulatory Visit: Payer: Self-pay

## 2013-03-20 ENCOUNTER — Other Ambulatory Visit: Payer: Self-pay | Admitting: Family Medicine

## 2013-03-20 HISTORY — PX: THYROID SURGERY: SHX805

## 2013-03-21 NOTE — Telephone Encounter (Signed)
Electronic refill request, please advise  

## 2013-03-21 NOTE — Telephone Encounter (Signed)
Px written for call in   

## 2013-03-21 NOTE — Telephone Encounter (Signed)
Called in Rx as prescribed 

## 2013-05-03 ENCOUNTER — Other Ambulatory Visit: Payer: PRIVATE HEALTH INSURANCE

## 2013-05-09 ENCOUNTER — Encounter: Payer: PRIVATE HEALTH INSURANCE | Admitting: Family Medicine

## 2013-05-19 ENCOUNTER — Other Ambulatory Visit: Payer: Self-pay | Admitting: Family Medicine

## 2013-05-19 NOTE — Telephone Encounter (Signed)
Px written for call in   

## 2013-05-23 NOTE — Telephone Encounter (Signed)
Rx just called in earlier today

## 2013-05-23 NOTE — Telephone Encounter (Signed)
Rx called in as prescribed 

## 2013-06-13 ENCOUNTER — Other Ambulatory Visit: Payer: Self-pay | Admitting: Family Medicine

## 2013-06-13 NOTE — Telephone Encounter (Signed)
Will refill electronically  

## 2013-07-11 ENCOUNTER — Other Ambulatory Visit: Payer: Self-pay | Admitting: Family Medicine

## 2013-07-12 ENCOUNTER — Other Ambulatory Visit: Payer: Self-pay | Admitting: Family Medicine

## 2013-07-12 ENCOUNTER — Other Ambulatory Visit: Payer: Self-pay | Admitting: *Deleted

## 2013-07-12 NOTE — Telephone Encounter (Signed)
Please schedule f/u this summer and refill until then  

## 2013-07-12 NOTE — Telephone Encounter (Signed)
Declined and advise pharmacy not due until 07/19/13

## 2013-07-12 NOTE — Telephone Encounter (Signed)
Is this a little early? Got 60 with a refill 4/2 (for bid prn dosing)

## 2013-07-12 NOTE — Telephone Encounter (Signed)
Last office visit 06/04/2012.  Last refilled 11/09/2012 for #60 with 5 refills.  Ok to refill?

## 2013-07-12 NOTE — Telephone Encounter (Signed)
Electronic refill request, please advise  

## 2013-07-14 NOTE — Telephone Encounter (Signed)
Left voicemail requesting pt to call office 

## 2013-07-16 ENCOUNTER — Other Ambulatory Visit: Payer: Self-pay | Admitting: Family Medicine

## 2013-07-18 NOTE — Telephone Encounter (Signed)
Px written for call in   

## 2013-07-18 NOTE — Telephone Encounter (Signed)
Electronic refill request, please advise  

## 2013-07-19 MED ORDER — LIDOCAINE 5 % EX PTCH: 2.0000 | MEDICATED_PATCH | CUTANEOUS | Status: DC

## 2013-07-19 NOTE — Telephone Encounter (Signed)
Phoned in to pharmacy. 

## 2013-07-22 ENCOUNTER — Encounter: Payer: Self-pay | Admitting: Family Medicine

## 2013-07-22 ENCOUNTER — Ambulatory Visit (INDEPENDENT_AMBULATORY_CARE_PROVIDER_SITE_OTHER): Payer: PRIVATE HEALTH INSURANCE | Admitting: Family Medicine

## 2013-07-22 VITALS — BP 128/80 | HR 96 | Temp 98.2°F | Ht 61.25 in | Wt 204.8 lb

## 2013-07-22 DIAGNOSIS — M170 Bilateral primary osteoarthritis of knee: Secondary | ICD-10-CM

## 2013-07-22 DIAGNOSIS — E7800 Pure hypercholesterolemia, unspecified: Secondary | ICD-10-CM

## 2013-07-22 DIAGNOSIS — IMO0002 Reserved for concepts with insufficient information to code with codable children: Secondary | ICD-10-CM

## 2013-07-22 DIAGNOSIS — M1710 Unilateral primary osteoarthritis, unspecified knee: Secondary | ICD-10-CM

## 2013-07-22 DIAGNOSIS — E669 Obesity, unspecified: Secondary | ICD-10-CM

## 2013-07-22 LAB — COMPREHENSIVE METABOLIC PANEL
ALBUMIN: 3.9 g/dL (ref 3.5–5.2)
ALK PHOS: 54 U/L (ref 39–117)
ALT: 44 U/L — AB (ref 0–35)
AST: 36 U/L (ref 0–37)
BUN: 16 mg/dL (ref 6–23)
CHLORIDE: 104 meq/L (ref 96–112)
CO2: 28 mEq/L (ref 19–32)
Calcium: 9.6 mg/dL (ref 8.4–10.5)
Creatinine, Ser: 0.8 mg/dL (ref 0.4–1.2)
GFR: 78.71 mL/min (ref >60.00)
Glucose, Bld: 92 mg/dL (ref 70–99)
POTASSIUM: 4.3 meq/L (ref 3.5–5.1)
Sodium: 138 mEq/L (ref 135–145)
Total Bilirubin: 0.1 mg/dL — ABNORMAL LOW (ref 0.2–1.2)
Total Protein: 6.7 g/dL (ref 6.0–8.3)

## 2013-07-22 LAB — LIPID PANEL
CHOL/HDL RATIO: 3
Cholesterol: 171 mg/dL (ref 0–200)
HDL: 57.4 mg/dL (ref >39.00)
LDL CALC: 83 mg/dL (ref 0–99)
NONHDL: 113.6
TRIGLYCERIDES: 152 mg/dL — AB (ref 0.0–149.0)
VLDL: 30.4 mg/dL (ref 0.0–40.0)

## 2013-07-22 MED ORDER — OMEPRAZOLE 20 MG PO CPDR: DELAYED_RELEASE_CAPSULE | ORAL | Status: DC

## 2013-07-22 MED ORDER — TRAMADOL HCL 50 MG PO TABS: ORAL_TABLET | ORAL | Status: DC

## 2013-07-22 MED ORDER — SIMVASTATIN 40 MG PO TABS: ORAL_TABLET | ORAL | Status: DC

## 2013-07-22 MED ORDER — ETODOLAC 500 MG PO TABS: 500.0000 mg | ORAL_TABLET | Freq: Two times a day (BID) | ORAL | Status: AC

## 2013-07-22 MED ORDER — LIDODERM 5 % EX PTCH: 2.0000 | MEDICATED_PATCH | CUTANEOUS | Status: DC

## 2013-07-22 NOTE — Patient Instructions (Signed)
Please schedule your annual mammogram  If you are interested in a shingles/zoster vaccine - call your insurance to check on coverage,( you should not get it within 1 month of other vaccines) , then call us for a prescription  for it to take to a pharmacy that gives the shot , or make a nurse visit to get it here depending on your coverage Labs today  Avoid red meat/ fried foods/ egg yolks/ fatty breakfast meats/ butter, cheese and high fat dairy/ and shellfish   Keep exercising

## 2013-07-22 NOTE — Assessment & Plan Note (Signed)
Refilled lodine and ultram and lidoderm  Doing ok and swimming for exercise  Knows that wt loss will be most helpful

## 2013-07-22 NOTE — Assessment & Plan Note (Signed)
Discussed how this problem influences overall health and the risks it imposes  Reviewed plan for weight loss with lower calorie diet (via better food choices and also portion control or program like weight watchers) and exercise building up to or more than 30 minutes 5 days per week including some aerobic activity    

## 2013-07-22 NOTE — Assessment & Plan Note (Signed)
Lipid panel and cmet today  Pt has been off track with diet - rev her last lipid prof and rev low chol diet  On zocor-no problems - refilled

## 2013-07-22 NOTE — Progress Notes (Signed)
Subjective:    Patient ID: Lori Lawrence, female    DOB: 1950/10/16, 63 y.o.   MRN: [id number]  HPI Here for f/u of chronic medical problems   Hyperlipidemia  On zocor and diet Lab Results  Component Value Date   CHOL 179 04/29/2012   CHOL 201* 11/13/2010   Lab Results  Component Value Date   HDL 56.30 04/29/2012   HDL 59.60 11/13/2010   Lab Results  Component Value Date   LDLCALC 100* 04/29/2012   Lab Results  Component Value Date   TRIG 115.0 04/29/2012   TRIG 188.0* 11/13/2010   Lab Results  Component Value Date   CHOLHDL 3 04/29/2012   CHOLHDL 3 11/13/2010   Lab Results  Component Value Date   LDLDIRECT 121.8 11/13/2010    Wt is up 3 lb with bmi of 38  Thyroid - she feels a lot better after since her thyroid was taken care   She is interested in shingles vaccine - she will call ins for coverage    Last glucose was 117  She was slacking on diet lately-now getting back on track  Plans to eat more fresh vegetables/ grilled lean protein and smaller portions  She gets lots of exercise - swimming -easiest on her joints and out with her dogs a lot   On omeprazole to protect stomach from lodine  Working well  No complaints   Patient Active Problem List   Diagnosis Date Noted  . Osteoarthritis of both knees 12/10/2012  . Rapid heart rate 06/04/2012  . Goiter 06/04/2012  . Sleep apnea 12/29/2011  . Gynecological examination 11/18/2010  . Routine general medical examination at a health care facility 11/11/2010  . Degenerative disc disease, lumbar 08/02/2010  . COLONIC POLYPS 04/24/2010  . 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 HYPERCHOLESTEROLEMIA 04/24/2010  . Osteopenia 04/24/2010  . MURMUR 04/24/2010  . ELEVATED BP READING WITHOUT DX HYPERTENSION 04/24/2010  . SKIN CANCER, HX OF 04/24/2010  . CHICKENPOX, HX OF 04/24/2010  . Left thyroid nodule 06/26/2002   Past Medical History  Diagnosis Date  . Anemia     NOS  . Cancer     hx of skin CA  .  Arthritis   . DDD (degenerative disc disease)     of the spine (pain clinic)  . History of chicken pox   . GERD (gastroesophageal reflux disease)   . Heart murmur   . Hyperlipidemia   . Hypertension   . Colon polyp   . Hyperglycemia   . Acne   . Sleep apnea     wilth cpap  . Dry eye syndrome   . Osteoporosis   . Vitamin D deficiency   . Thyroid disease     thyroid problem thyroid nodule with neg bx in past   Past Surgical History  Procedure Laterality Date  . Tonsillectomy  1973  . Spinal cord stimulator implant    . Wisdom tooth extraction    . Hemiarthroplasty shoulder fracture Right 2013    Dr. Carney Bern at [facility]  . Thyroid surgery  feb 2015   History  Substance Use Topics  . Smoking status: Never Smoker   . Smokeless tobacco: Not on file  . Alcohol Use: Yes     Comment: occ   Family History  Problem Relation Age of Onset  . Arthritis Mother   . Cancer Mother     thyroid CA  . Diabetes Mother   . Arthritis Father   .  Heart disease Father   . Hyperlipidemia Father   . Hypertension Father   . Cancer Brother     thyroid CA  . Hypertension Brother   . Heart disease Brother     stent before age 50.  . Diabetes Maternal Grandmother   . Kidney disease Maternal Grandmother    Allergies  Allergen Reactions  . Lyrica [Pregabalin]     sleepy  . Nitric Oxide [Nitrogen Oxide]    Current Outpatient Prescriptions on File Prior to Visit  Medication Sig Dispense Refill  . ammonium lactate (AMLACTIN) 12 % cream Apply topically as needed for dry skin.      Marland Kitchen aspirin 81 MG tablet Take 81 mg by mouth daily.      . calcium carbonate (TUMS CALCIUM FOR LIFE BONE) 750 MG chewable tablet Chew 2 tablets by mouth daily.        . cholecalciferol (VITAMIN D) 1000 UNITS tablet Take 1,000-2,000 Units by mouth daily.        . clindamycin-tretinoin (ZIANA) gel Apply topically at bedtime.        . diphenhydramine-acetaminophen (TYLENOL PM) 25-500 MG TABS Take 1 tablet by mouth  at bedtime as needed.      . etodolac (LODINE) 500 MG tablet Take 1 tablet (500 mg total) by mouth 2 (two) times daily.  60 tablet  11  . lidocaine (LIDODERM) 5 % Place 2 patches onto the skin daily. As needed. Leave on a maximum of 12 hours in 24 hours  60 patch  0  . omeprazole (PRILOSEC) 20 MG capsule Take one capsule by mouth one time daily  30 capsule  2  . simvastatin (ZOCOR) 40 MG tablet Take one tablet by mouth   nightly at bedtime  30 tablet  2  . sulfamethoxazole-trimethoprim (BACTRIM DS,SEPTRA DS) 800-160 MG per tablet Take 1 tablet by mouth daily. For acne.      . traMADol (ULTRAM) 50 MG tablet TAKE ONE TABLET BY MOUTH TWICE DAILY AS NEEDED FOR PAIN   60 tablet  5  . VOLTAREN 1 % GEL apply 4gm to affected area(s) four times per day   500 g  10   No current facility-administered medications on file prior to visit.     Review of Systems Review of Systems  Constitutional: Negative for fever, appetite change, fatigue and unexpected weight change.  Eyes: Negative for pain and visual disturbance.  Respiratory: Negative for cough and shortness of breath.   Cardiovascular: Negative for cp or palpitations    Gastrointestinal: Negative for nausea, diarrhea and constipation.  Genitourinary: Negative for urgency and frequency.  Skin: Negative for pallor or rash   MSK pos for joint pain from OA Neurological: Negative for weakness, light-headedness, numbness and headaches.  Hematological: Negative for adenopathy. Does not bruise/bleed easily.  Psychiatric/Behavioral: Negative for dysphoric mood. The patient is not nervous/anxious.         Objective:   Physical Exam  Constitutional: She appears well-developed and well-nourished. No distress.  obese and well appearing   HENT:  Head: Normocephalic and atraumatic.  Mouth/Throat: Oropharynx is clear and moist.  Eyes: Conjunctivae and EOM are normal. Pupils are equal, round, and reactive to light. No scleral icterus.  Neck: Normal range  of motion. Neck supple. No JVD present. Carotid bruit is not present. No thyromegaly present.  Cardiovascular: Normal rate, regular rhythm, normal heart sounds and intact distal pulses.  Exam reveals no gallop.   No murmur heard. Pulmonary/Chest: Effort normal and breath sounds  normal. No respiratory distress. She has no wheezes. She has no rales.  Abdominal: Soft. Bowel sounds are normal. She exhibits no distension, no abdominal bruit and no mass. There is no tenderness.  Musculoskeletal: She exhibits no edema.  Lymphadenopathy:    She has no cervical adenopathy.  Neurological: She is alert. She has normal reflexes. No cranial nerve deficit. She exhibits normal muscle tone. Coordination normal.  Skin: Skin is warm and dry. No rash noted. No erythema. No pallor.  Psychiatric: She has a normal mood and affect.          Assessment & Plan:   Problem List Items Addressed This Visit     Musculoskeletal and Integument   Osteoarthritis of both knees     Refilled lodine and ultram and lidoderm  Doing ok and swimming for exercise  Knows that wt loss will be most helpful     Relevant Medications      traMADol (ULTRAM) 50 MG tablet      etodolac (LODINE) tablet     Other   HYPERCHOLESTEROLEMIA - Primary     Lipid panel and cmet today  Pt has been off track with diet - rev her last lipid prof and rev low chol diet  On zocor-no problems - refilled     Relevant Medications      simvastatin (ZOCOR) tablet   Other Relevant Orders      Lipid panel (Completed)      Comprehensive metabolic panel (Completed)   Obesity     Discussed how this problem influences overall health and the risks it imposes  Reviewed plan for weight loss with lower calorie diet (via better food choices and also portion control or program like weight watchers) and exercise building up to or more than 30 minutes 5 days per week including some aerobic activity

## 2013-07-22 NOTE — Progress Notes (Signed)
Pre visit review using our clinic review tool, if applicable. No additional management support is needed unless otherwise documented below in the visit note. 

## 2013-07-26 ENCOUNTER — Telehealth: Payer: Self-pay

## 2013-07-26 NOTE — Telephone Encounter (Signed)
Pt checked with ins co and ins will approve shinges vaccine in dr office; pt scheduled for 08/25/13 at 9 am. Pt thought she could come very early to get injection; advised I could schedule at 8:30 am; pt siad that is OK to do 9 am.

## 2013-08-25 ENCOUNTER — Ambulatory Visit (INDEPENDENT_AMBULATORY_CARE_PROVIDER_SITE_OTHER): Payer: PRIVATE HEALTH INSURANCE

## 2013-08-25 ENCOUNTER — Encounter: Payer: Self-pay | Admitting: *Deleted

## 2013-08-25 DIAGNOSIS — Z2911 Encounter for prophylactic immunotherapy for respiratory syncytial virus (RSV): Secondary | ICD-10-CM

## 2013-08-25 DIAGNOSIS — Z23 Encounter for immunization: Secondary | ICD-10-CM

## 2013-09-01 ENCOUNTER — Encounter: Payer: Self-pay | Admitting: Family Medicine

## 2013-09-06 ENCOUNTER — Encounter: Payer: Self-pay | Admitting: Family Medicine

## 2013-09-07 ENCOUNTER — Encounter: Payer: Self-pay | Admitting: Family Medicine

## 2013-09-08 ENCOUNTER — Other Ambulatory Visit: Payer: Self-pay | Admitting: Radiology

## 2013-09-13 ENCOUNTER — Encounter: Payer: Self-pay | Admitting: Family Medicine

## 2013-09-14 ENCOUNTER — Encounter: Payer: Self-pay | Admitting: Family Medicine

## 2013-09-30 IMAGING — CR DG KNEE AP/LAT W/ SUNRISE*R*
3 series · 5 of 5 positions shown · non-contrast
Comparison: None.

CLINICAL DATA: Knee pain bilateral

DG KNEE - 3 VIEWS

[Series 1001: view not recorded · 0.15mm/px · 2 of 2 slices shown (1 of 3)]
[im 1/2]
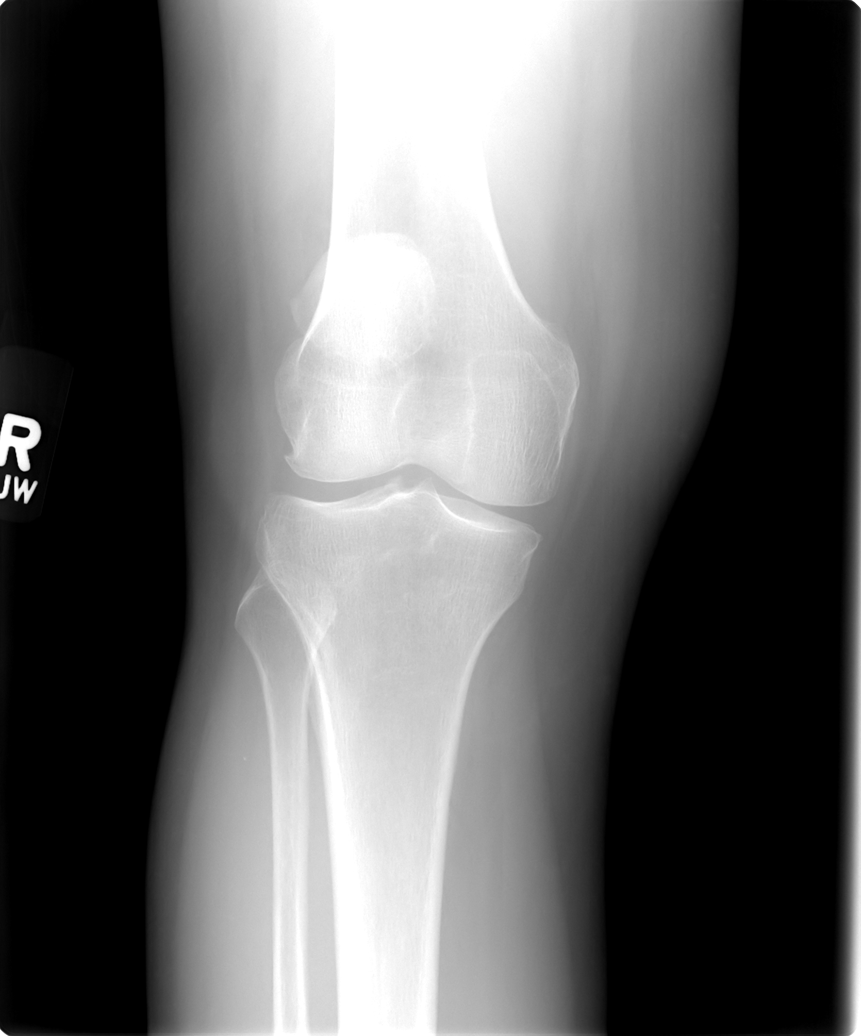
[im 2/2]
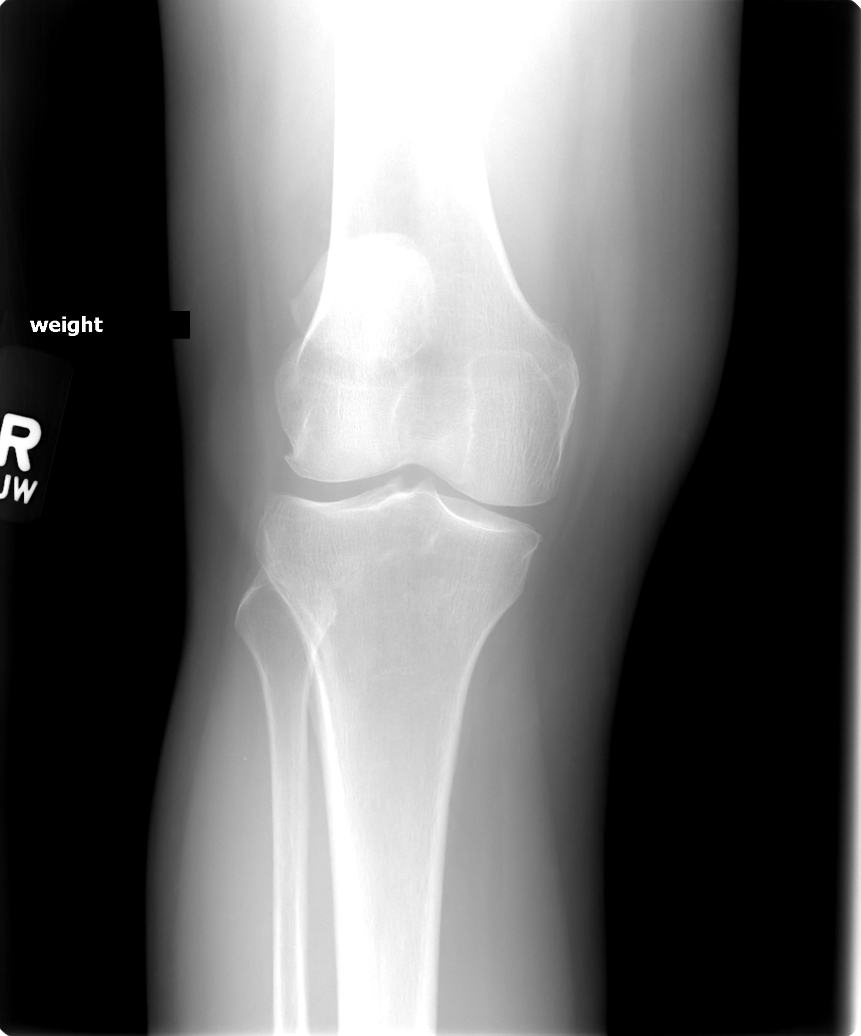

[Series 1002: view not recorded · 0.15mm/px · 2 of 2 slices shown (2 of 3)]
[im 1/2]
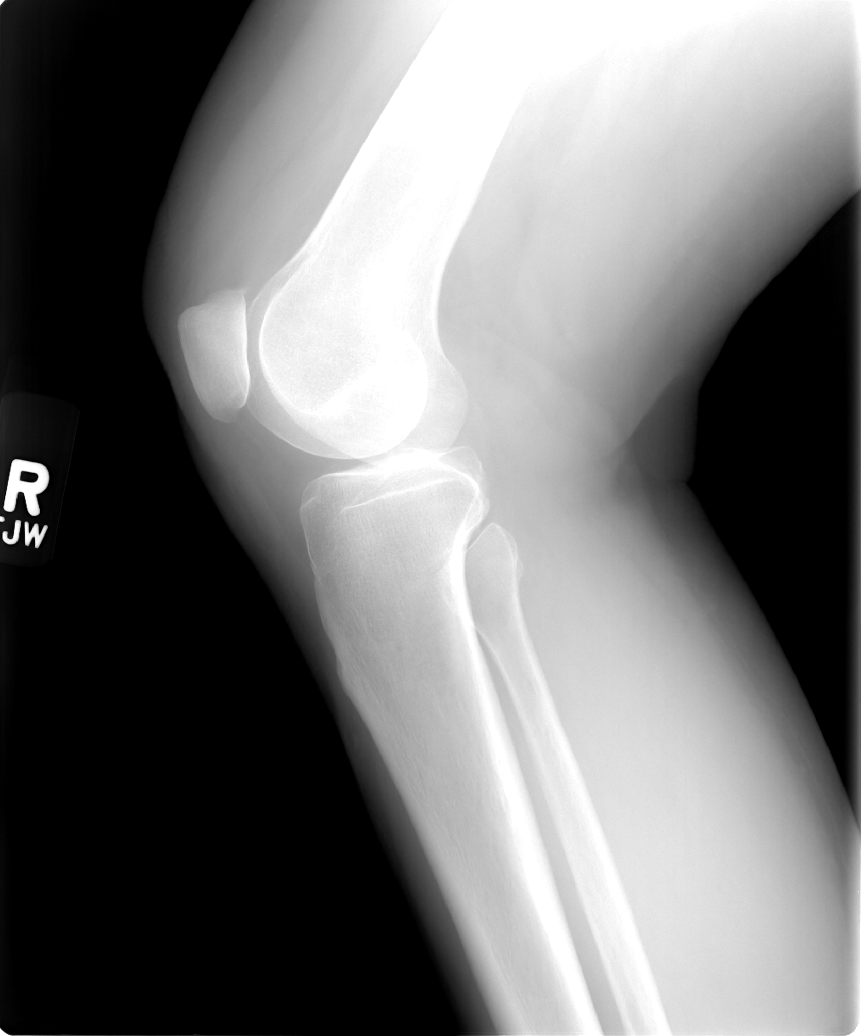
[im 2/2]
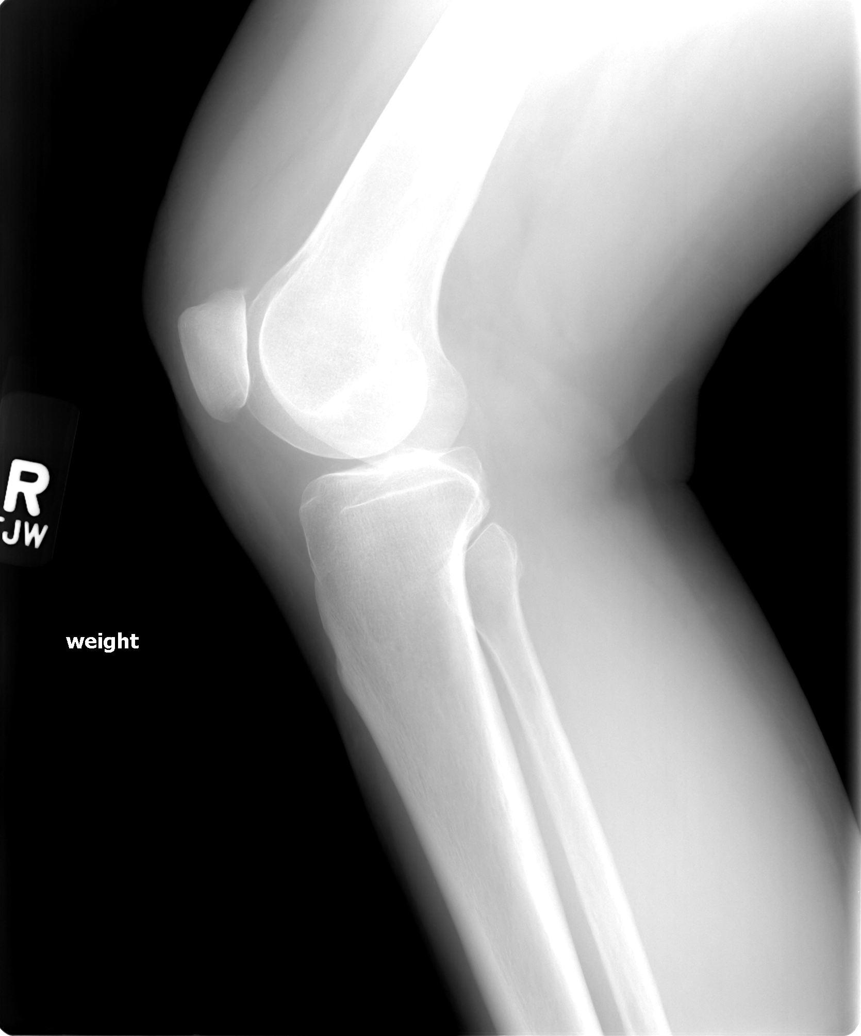

[view not recorded (3 of 3)]
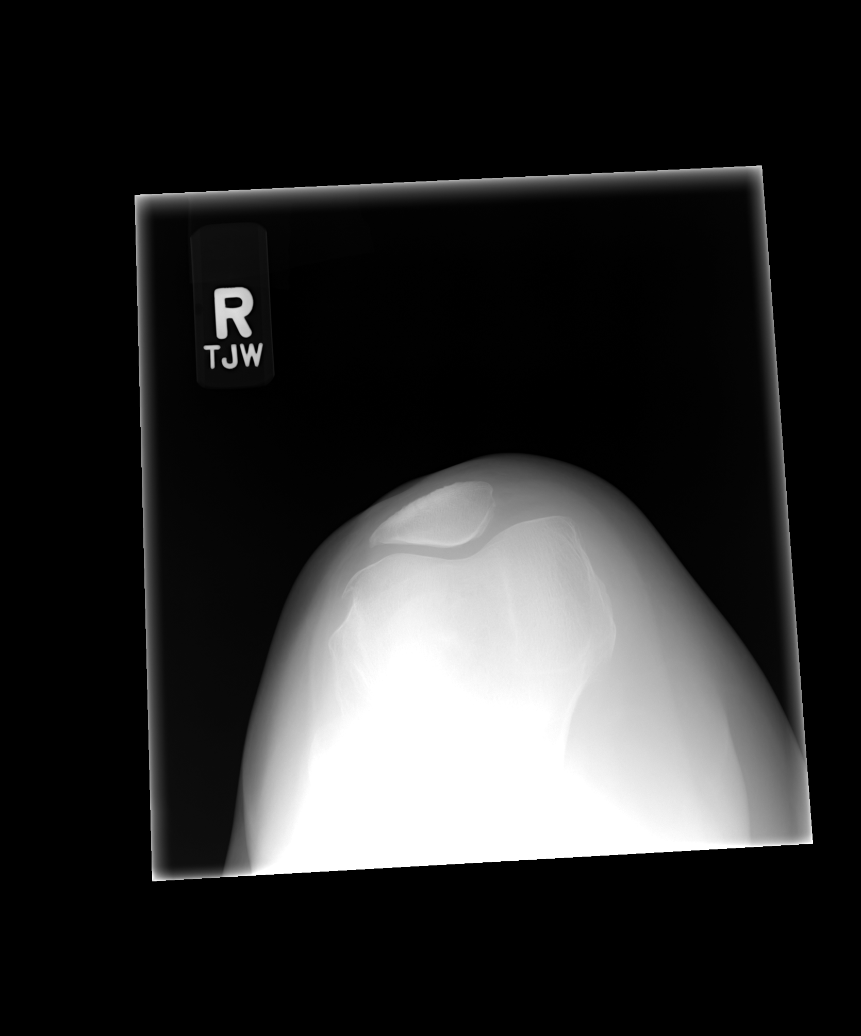

[5 of 5 positions shown; findings below may reference images not displayed]

FINDINGS: Negative for fracture.  Mild to spurring in the lateral
joint compartment.  Minimal patellofemoral degenerative change.
Medial compartment is normal.  There is a joint effusion.  Negative
for fracture.
IMPRESSION: Knee joint effusion with mild degenerative change.

## 2013-10-02 IMAGING — US US SOFT TISSUE HEAD/NECK
1 series · 13 of 25 positions shown · non-contrast
Comparison: Dictated report from ultrasound at the time of biopsy
at [HOSPITAL] dated 11/01/2002.

CLINICAL DATA: History of goiter with prominent left lobe.

THYROID ULTRASOUND
TECHNIQUE: Ultrasound examination of the thyroid gland and adjacent
soft tissues was performed.

[Series 1: us soft tissue head/neck · 0.12mm/px · 13 of 60 slices shown]
[im 1/60]
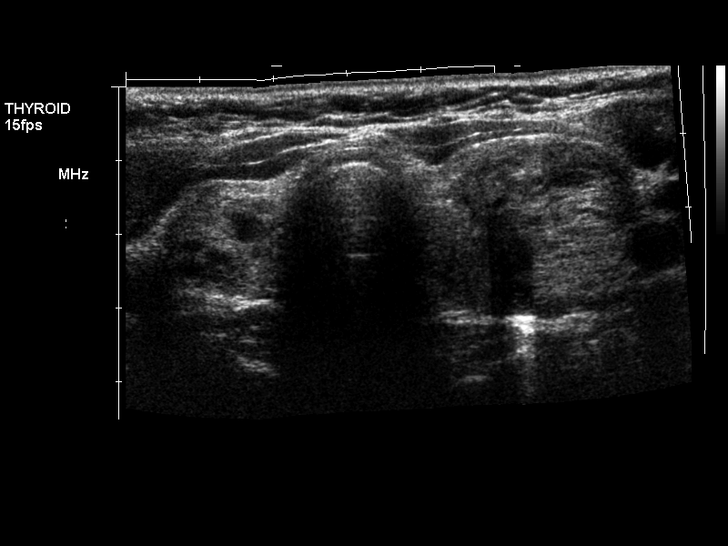
[im 5/60]
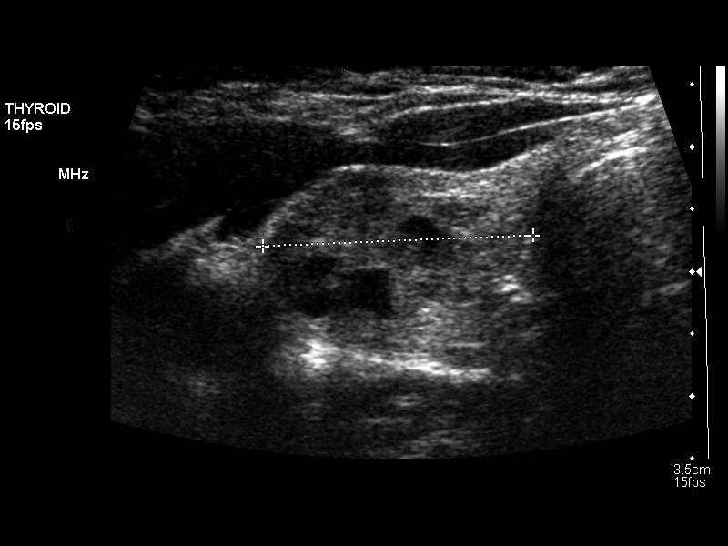
[im 10/60]
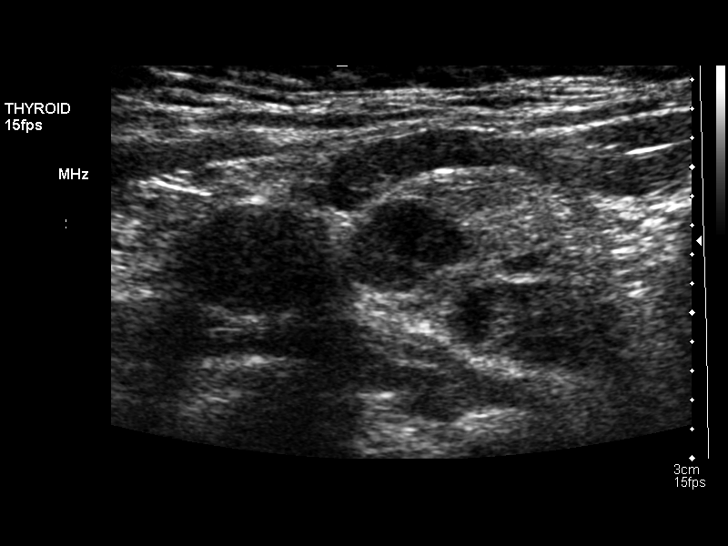
[im 15/60]
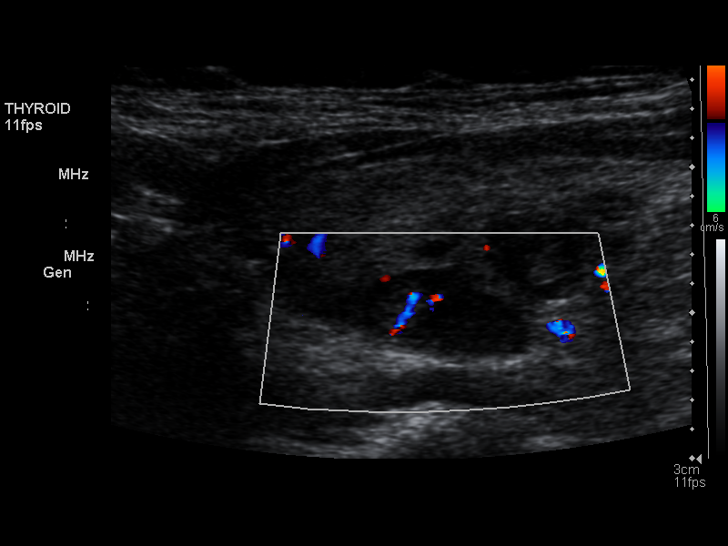
[im 20/60]
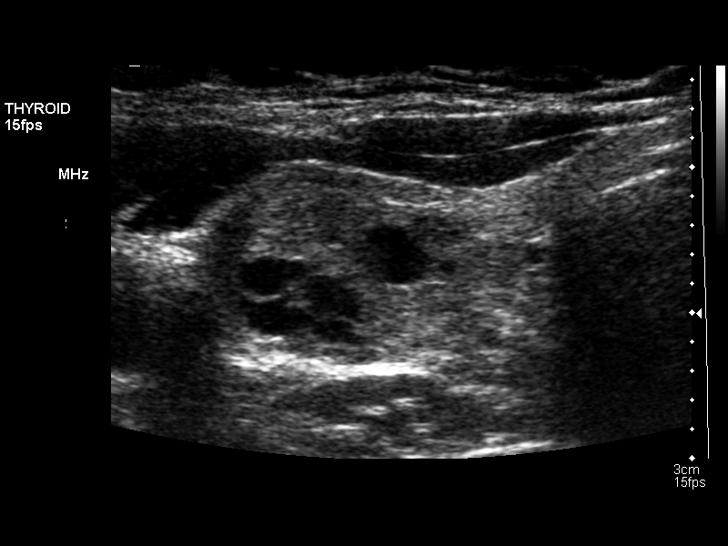
[im 25/60]
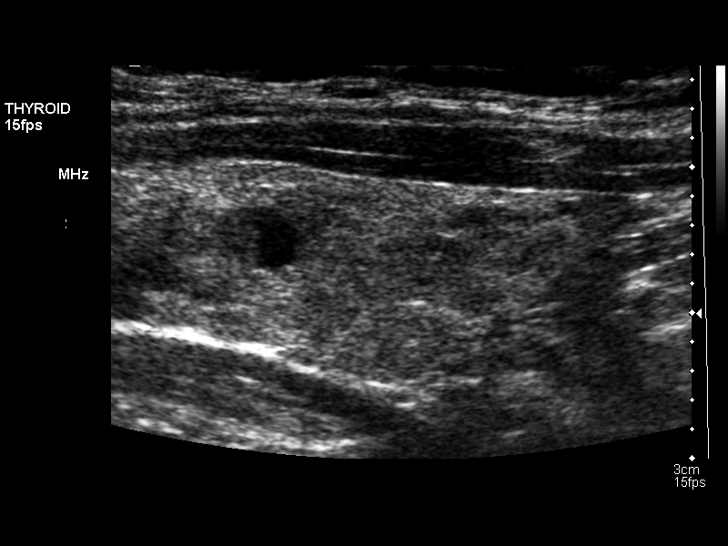
[im 30/60]
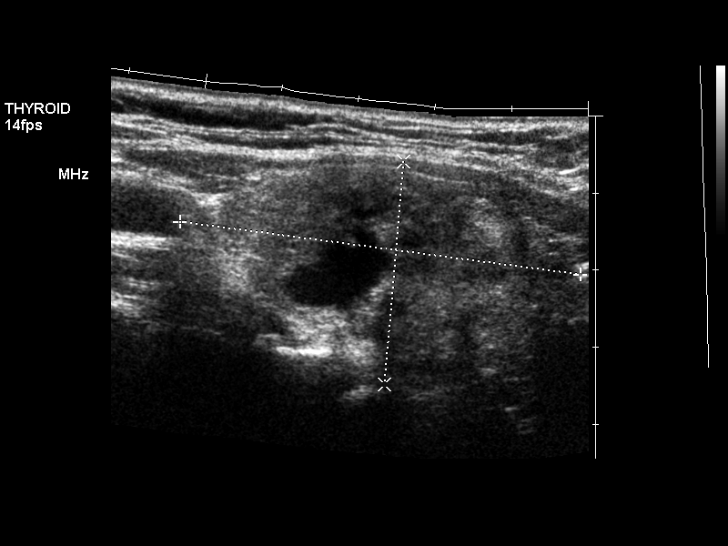
[im 35/60]
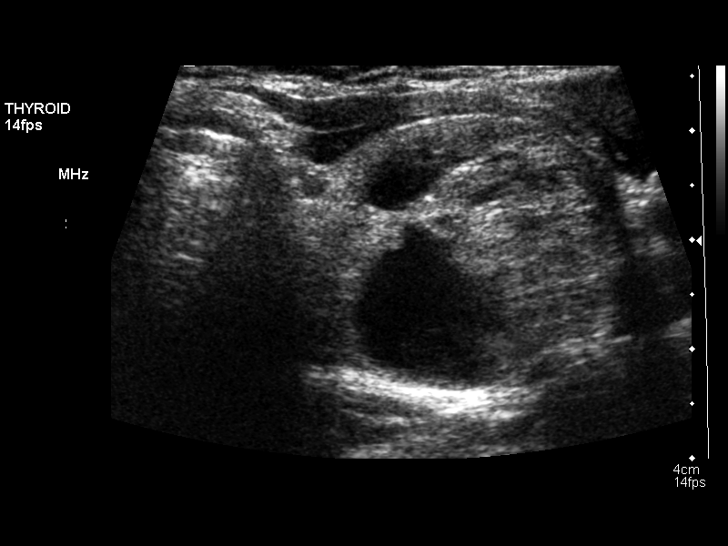
[im 40/60]
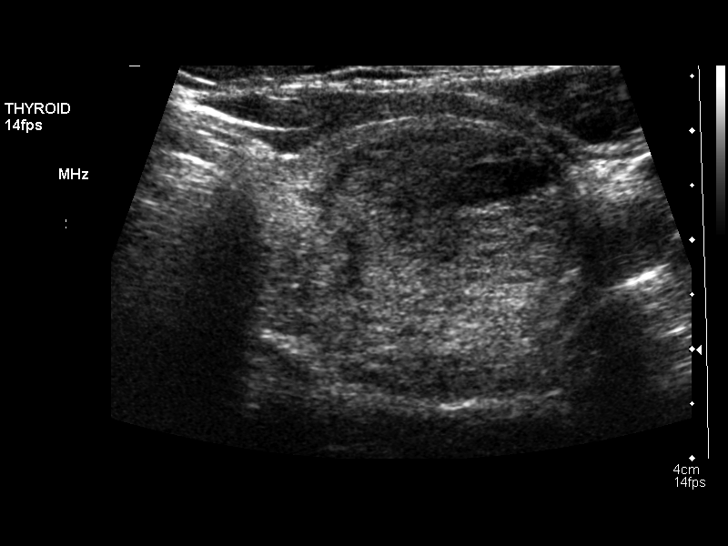
[im 45/60]
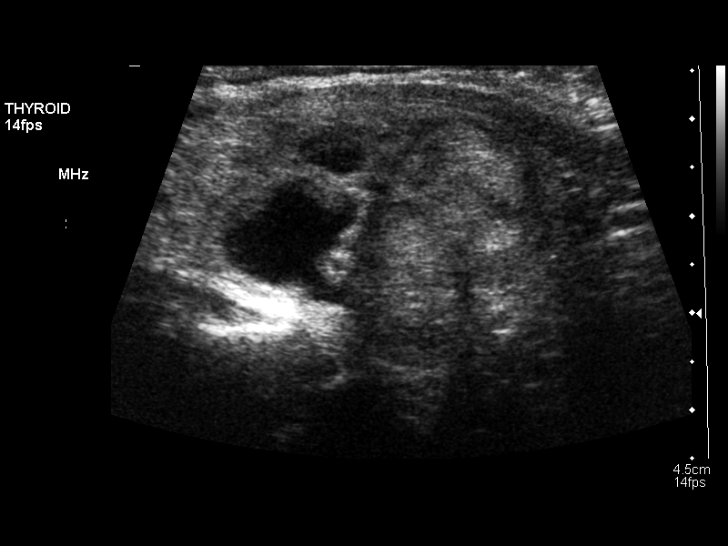
[im 50/60]
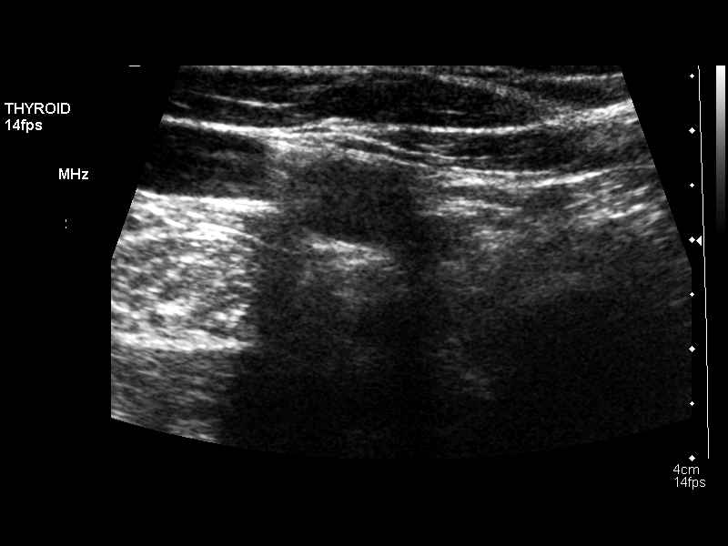
[im 55/60]
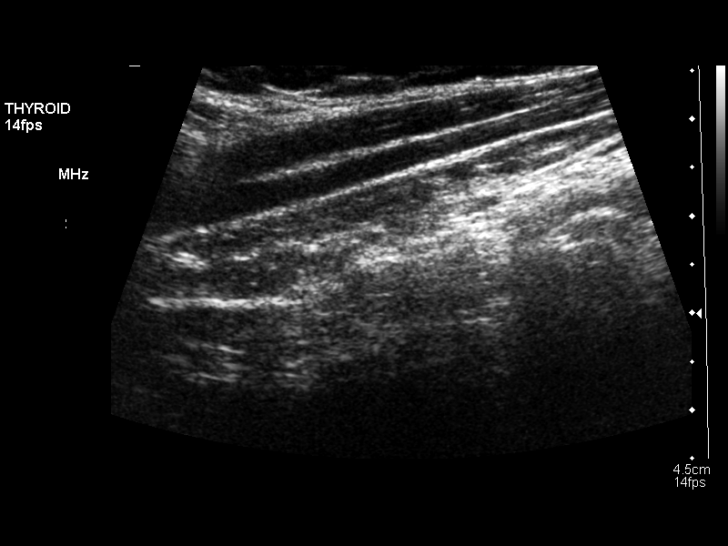
[im 60/60]
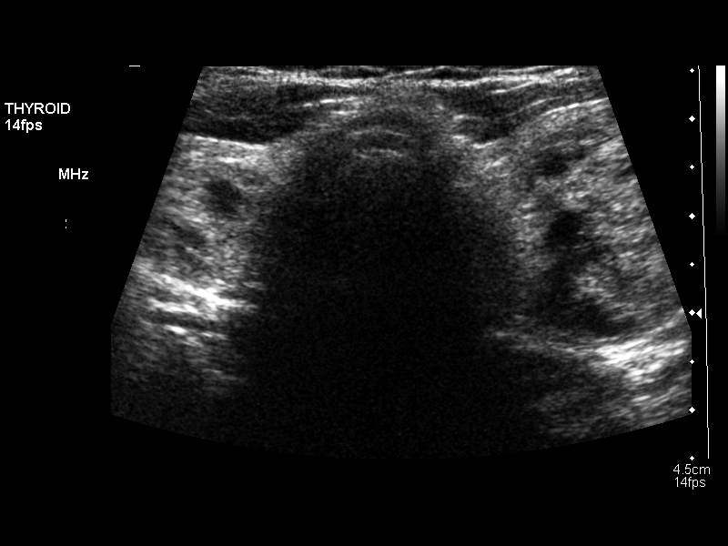

[13 of 25 positions shown; findings below may reference images not displayed]

Also dictated report
from thyroid nuclear medicine scan of 10/26/2002 demonstrating a
cold nodule in the left lobe .No measurements were supplied from
those prior studies.
FINDINGS: Right thyroid lobe:  5.0 x 1.6 x 2.2 cm.
Left thyroid lobe:  5.3 x 2.9 x 3.2 cm.
Isthmus:  4.5 mm in thickness.

Focal nodules:  The echogenicity of the thyroid parenchyma is
inhomogeneous. Complex and solid nodules are noted bilaterally.
Much of the left lobe is occupied by a complex nodule both cystic
and solid measuring 4.3 x 2.6 x 2.8 cm.  Nodules on the right are
noted of no more than 1.2 cm in diameter.

Lymphadenopathy:  None visualized.
IMPRESSION: Bilateral thyroid nodules with the dominant nodule being complex
occupying much of the left lobe as noted above.  Measurements from
the prior studies are not available for comparison as described
above.  This pattern is most consistent with multinodular goiter,
somewhat asymmetric.

## 2013-10-25 ENCOUNTER — Encounter: Payer: Self-pay | Admitting: Family Medicine

## 2013-11-04 IMAGING — US US THYROID BIOPSY
1 series · 12 of 12 positions shown · non-contrast
Comparison: none

CLINICAL DATA: Enlarging complex dominant left nodule.

ULTRASOUND-GUIDED THYROID ASPIRATION BIOPSY
TECHNIQUE: Survey ultrasound was performed and the dominant lesion
in the mid-left lobe was localized.  An appropriate skin entry site
was determined.  Skin was marked, then prepped with Betadine,
draped in usual sterile fashion, and infiltrated locally with 1%
lidocaine.  Under real-time ultrasound guidance, 3  passes were
made into the lesion with 25 gauge needles.  The patient tolerated
procedure well, with no immediate complications.
IMPRESSION
1.  Technically successful ultrasound-guided thyroid aspiration
biopsy

[Series 1: us thyroid biopsy · 0.08mm/px · 12 acquisitions, 12 frames shown]
[im 1/12]
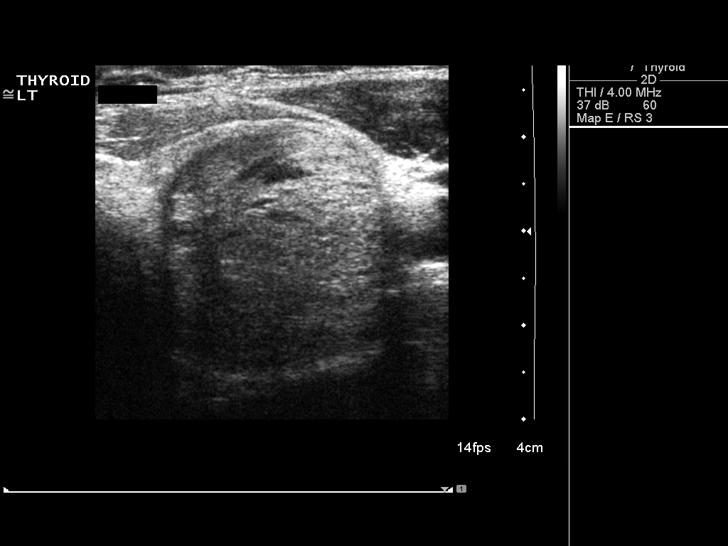
[im 2/12]
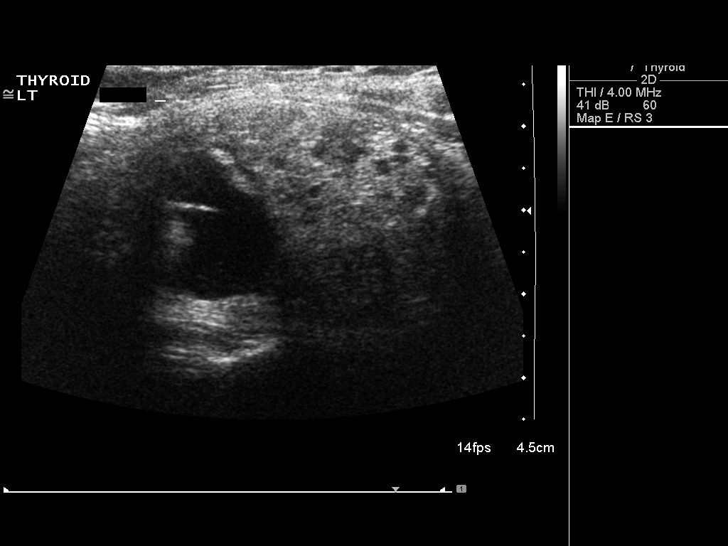
[im 3/12]
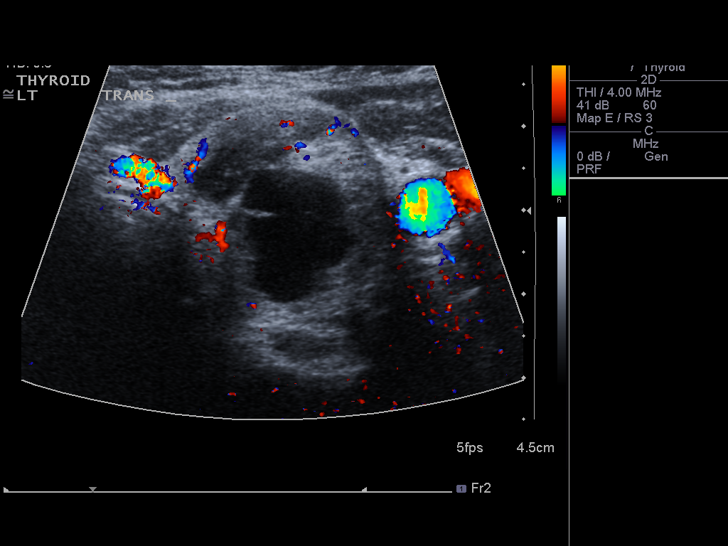
[im 4/12]
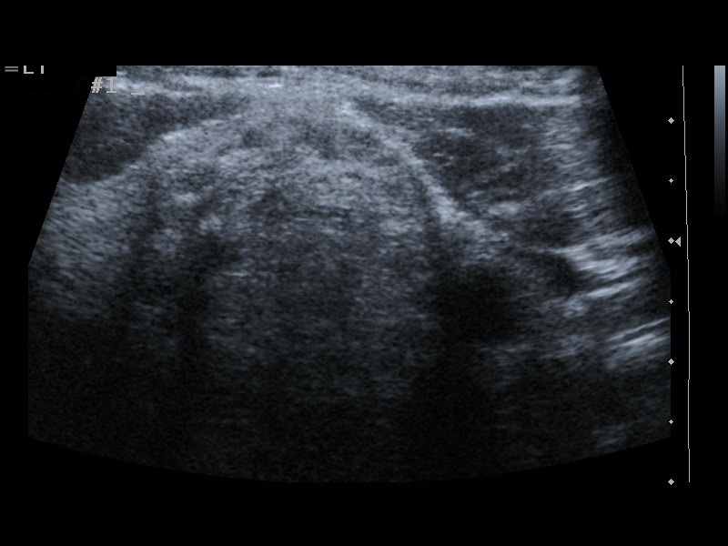
[im 5/12]
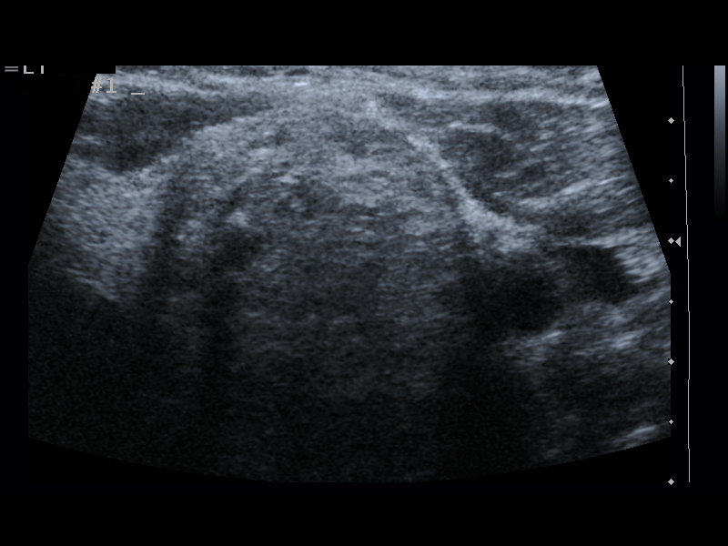
[im 6/12]
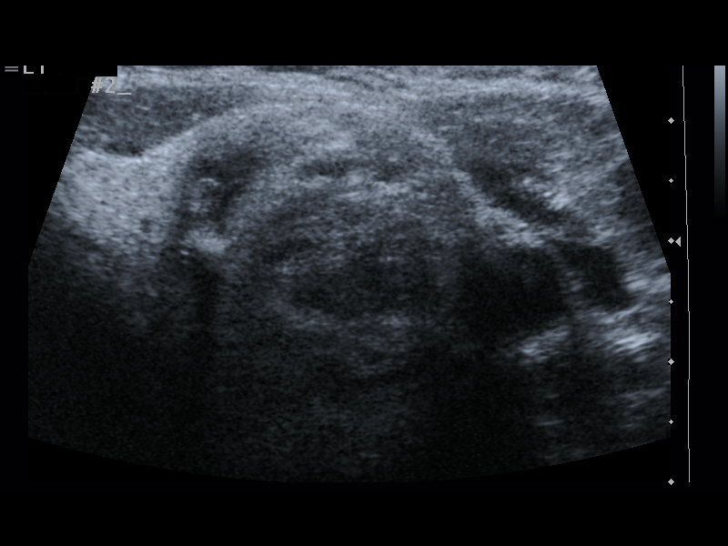
[im 7/12]
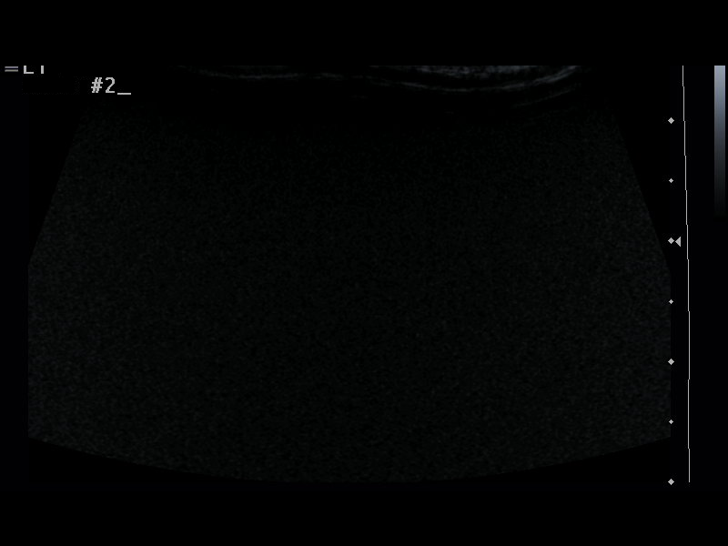
[im 8/12]
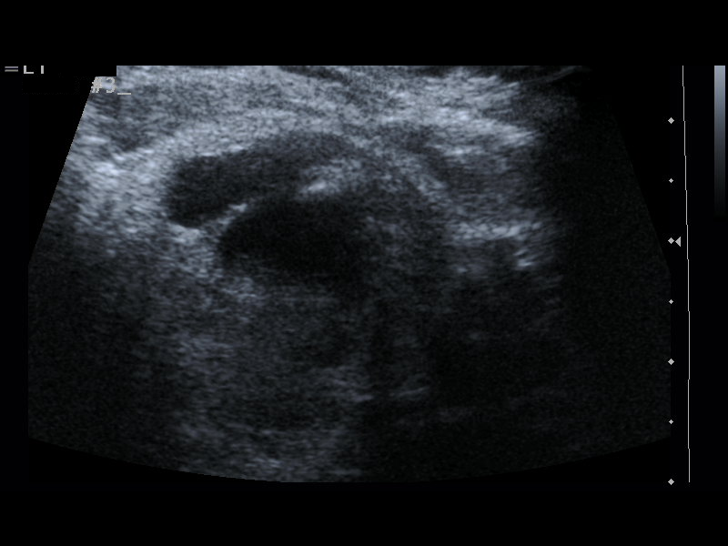
[im 9/12]
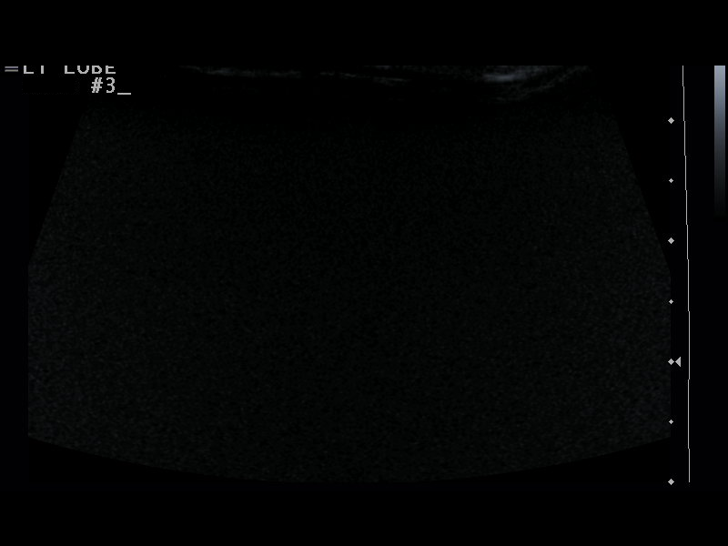
[im 10/12]
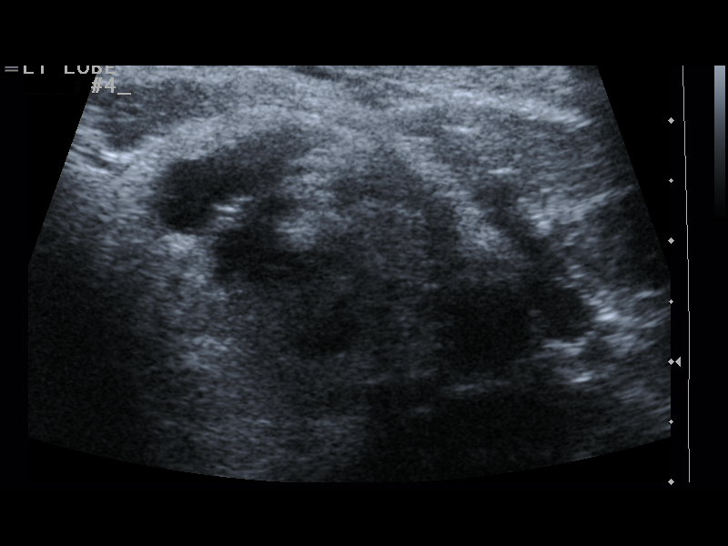
[im 11/12]
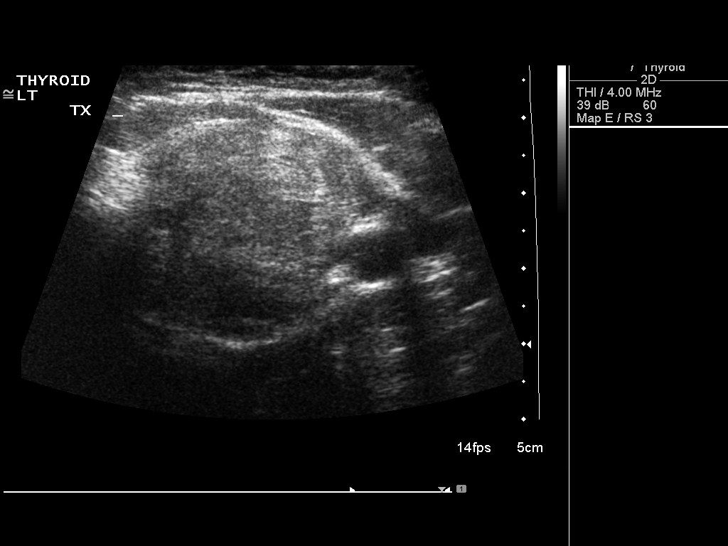
[im 12/12]
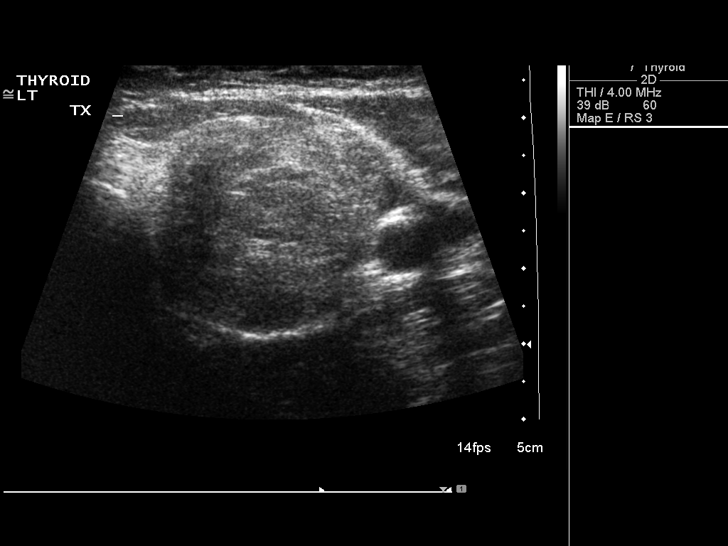

[12 of 12 positions shown; findings below may reference images not displayed]

## 2014-01-17 ENCOUNTER — Other Ambulatory Visit: Payer: Self-pay

## 2014-01-17 MED ORDER — TRAMADOL HCL 50 MG PO TABS: ORAL_TABLET | ORAL | Status: DC

## 2014-01-17 NOTE — Telephone Encounter (Signed)
Pt left v/m; for more than one week [facility] Lori Lawrence has requested refills on tramadol; pt request cb today with status of tramadol refill.(do not see refill request in pts chart).

## 2014-01-17 NOTE — Telephone Encounter (Signed)
Medication phoned to pharmacy. Left detailed message on voicemail.  

## 2014-01-17 NOTE — Telephone Encounter (Signed)
Tell her we have not rec refill requests from them  Px written for call in

## 2014-05-27 ENCOUNTER — Other Ambulatory Visit: Payer: Self-pay | Admitting: Family Medicine

## 2014-05-29 NOTE — Telephone Encounter (Signed)
Tramadol refill request.  Last seen 07/22/2013.  Last filled 01/17/2014.  Please advise.

## 2014-05-29 NOTE — Telephone Encounter (Signed)
[facility].

## 2014-05-29 NOTE — Telephone Encounter (Signed)
Px written for call in   

## 2014-07-20 LAB — HM DEXA SCAN

## 2014-07-22 ENCOUNTER — Other Ambulatory Visit: Payer: Self-pay | Admitting: Family Medicine

## 2014-07-23 ENCOUNTER — Other Ambulatory Visit: Payer: Self-pay | Admitting: Family Medicine

## 2014-07-24 NOTE — Telephone Encounter (Signed)
Please schedule f/u and refill until then  

## 2014-07-24 NOTE — Telephone Encounter (Signed)
Electronic refill request, last OV was 07/22/13 and no future appt., please advise

## 2014-07-24 NOTE — Telephone Encounter (Signed)
Left voicemail requesting pt to call office back 

## 2014-07-24 NOTE — Telephone Encounter (Signed)
Please schedule summer f/u and refill until then 

## 2014-07-25 ENCOUNTER — Encounter: Payer: Self-pay | Admitting: Family Medicine

## 2014-07-25 NOTE — Telephone Encounter (Signed)
appt scheduled and med refilled 

## 2014-07-25 NOTE — Telephone Encounter (Signed)
Pt left v/m requesting cb at [phone].

## 2014-07-25 NOTE — Telephone Encounter (Signed)
Pt left v/m requesting cb [phone].

## 2014-08-02 ENCOUNTER — Encounter: Payer: Self-pay | Admitting: *Deleted

## 2014-08-02 ENCOUNTER — Encounter: Payer: Self-pay | Admitting: Family Medicine

## 2014-08-29 ENCOUNTER — Ambulatory Visit: Payer: PRIVATE HEALTH INSURANCE | Admitting: Family Medicine

## 2018-07-13 ENCOUNTER — Other Ambulatory Visit: Payer: Self-pay | Admitting: Internal Medicine

## 2018-07-13 DIAGNOSIS — M5442 Lumbago with sciatica, left side: Secondary | ICD-10-CM

## 2018-07-13 DIAGNOSIS — G8929 Other chronic pain: Secondary | ICD-10-CM

## 2018-07-22 ENCOUNTER — Ambulatory Visit: Payer: PRIVATE HEALTH INSURANCE

## 2019-01-30 ENCOUNTER — Ambulatory Visit (HOSPITAL_COMMUNITY)
Admission: EM | Admit: 2019-01-30 | Discharge: 2019-01-30 | Disposition: A | Discharge status: Home / Self Care | Payer: PRIVATE HEALTH INSURANCE | Attending: Emergency Medicine | Admitting: Emergency Medicine

## 2019-01-30 ENCOUNTER — Other Ambulatory Visit: Payer: Self-pay

## 2019-01-30 ENCOUNTER — Encounter (HOSPITAL_COMMUNITY): Payer: Self-pay

## 2019-01-30 DIAGNOSIS — Z20822 Contact with and (suspected) exposure to covid-19: Secondary | ICD-10-CM

## 2019-01-30 DIAGNOSIS — Z20828 Contact with and (suspected) exposure to other viral communicable diseases: Secondary | ICD-10-CM

## 2019-01-30 DIAGNOSIS — U071 COVID-19: Secondary | ICD-10-CM | POA: Insufficient documentation

## 2019-01-30 DIAGNOSIS — I10 Essential (primary) hypertension: Secondary | ICD-10-CM

## 2019-01-30 NOTE — ED Triage Notes (Signed)
Pt presents to the UC for Covdi test. Pt states she was in contact with a positive case of Covid 2 weeks ago aprox. Pt denies any signs and symptoms.

## 2019-01-30 NOTE — Discharge Instructions (Signed)
Self isolate until covid results are back and negative.  °Will notify you by phone of any positive findings. Your negative results will be sent through your MyChart.     ° °

## 2019-01-30 NOTE — ED Provider Notes (Signed)
[facility]    CSN: [id number] Arrival date & time: 01/30/19  1631      History   Chief Complaint Chief Complaint  Patient presents with  . Covid test    HPI Lori Lawrence is a 68 y.o. female.   Lori Lawrence presents with concerns about exposure to covid-19. Was exposed to her asymptomatic positive granddaughter a few weeks ago. Otherwise feeling well without complaints. Did have a negative test 11/27. Was exposed 11/25. No symptoms of covid 19. Heart rate today is around her baseline, per patient.     ROS per HPI, negative if not otherwise mentioned.      Past Medical History:  Diagnosis Date  . Acne   . Anemia    NOS  . Arthritis   . Cancer (HCC)    hx of skin CA  . Colon polyp   . DDD (degenerative disc disease)    of the spine (pain clinic)  . Dry eye syndrome   . GERD (gastroesophageal reflux disease)   . Heart murmur   . History of chicken pox   . Hyperglycemia   . Hyperlipidemia   . Hypertension   . Osteoporosis   . Sleep apnea    wilth cpap  . Thyroid disease    thyroid problem thyroid nodule with neg bx in past  . Vitamin D deficiency     Patient Active Problem List   Diagnosis Date Noted  . Obesity 07/22/2013  . Osteoarthritis of both knees 12/10/2012  . Rapid heart rate 06/04/2012  . Goiter 06/04/2012  . Sleep apnea 12/29/2011  . Gynecological examination 11/18/2010  . Routine general medical examination at a health care facility 11/11/2010  . Degenerative disc disease, lumbar 08/02/2010  . COLONIC POLYPS 04/24/2010  . 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 HYPERCHOLESTEROLEMIA 04/24/2010  . Osteopenia 04/24/2010  . MURMUR 04/24/2010  . SKIN CANCER, HX OF 04/24/2010  . CHICKENPOX, HX OF 04/24/2010  . Left thyroid nodule 06/26/2002    Past Surgical History:  Procedure Laterality Date  . HEMIARTHROPLASTY SHOULDER FRACTURE Right 2013   Dr. Carney Bern at [facility]  . SPINAL CORD STIMULATOR IMPLANT    . THYROID  SURGERY  feb 2015  . TONSILLECTOMY  1973  . WISDOM TOOTH EXTRACTION      OB History   No obstetric history on file.      Home Medications    Prior to Admission medications   Medication Sig Start Date End Date Taking? Authorizing Provider  carvedilol (COREG) 12.5 MG tablet Take 12.5 mg by mouth 2 (two) times daily with a meal.   Yes [provider]  ammonium lactate (AMLACTIN) 12 % cream Apply topically as needed for dry skin.    [provider]  aspirin 81 MG tablet Take 81 mg by mouth daily.    [provider]  calcium carbonate (TUMS CALCIUM FOR LIFE BONE) 750 MG chewable tablet Chew 2 tablets by mouth daily.      [provider]  cholecalciferol (VITAMIN D) 1000 UNITS tablet Take 1,000-2,000 Units by mouth daily.      [provider]  clindamycin-tretinoin Pershing Proud) gel Apply topically at bedtime.      [provider]  diphenhydramine-acetaminophen (TYLENOL PM) 25-500 MG TABS Take 1 tablet by mouth at bedtime as needed.    [provider]  etodolac (LODINE) 500 MG tablet Take 1 tablet (500 mg total) by mouth 2 (two) times daily. 07/22/13   Tower, Pocahontas  A, MD  levothyroxine (SYNTHROID, LEVOTHROID) 50 MCG tablet Take 50 mcg by mouth daily before breakfast.    [provider]  LIDODERM 5 % PLACE 2 PATCHES ON THE SKIN DAILY, AS NEEDED. LEAVE ON A MAXIMUM OF 12 HOURS IN 24 HOURS. 07/25/14   Tower, Wynelle Fanny, MD  omeprazole (PRILOSEC) 20 MG capsule TAKE ONE CAPSULE BY MOUTH ONE TIME DAILY 07/25/14   Tower, Wynelle Fanny, MD  simvastatin (ZOCOR) 40 MG tablet TAKE ONE TABLET BY MOUTH NIGHTLY AT BEDTIME 07/25/14   Tower, Wynelle Fanny, MD  sulfamethoxazole-trimethoprim (BACTRIM DS,SEPTRA DS) 800-160 MG per tablet Take 1 tablet by mouth daily. For acne.    [provider]  traMADol (ULTRAM) 50 MG tablet TAKE ONE TABLET BY MOUTH TWICE DAILY AS NEEDED  05/29/14   Tower, Wynelle Fanny, MD  VOLTAREN 1 % GEL apply 4gm to affected area(s) four times  per day  06/13/13   Abner Greenspan, MD    Family History Family History  Problem Relation Age of Onset  . Arthritis Mother   . Cancer Mother        thyroid CA  . Diabetes Mother   . Arthritis Father   . Heart disease Father   . Hyperlipidemia Father   . Hypertension Father   . Cancer Brother        thyroid CA  . Hypertension Brother   . Heart disease Brother        stent before age 69.  . Diabetes Maternal Grandmother   . Kidney disease Maternal Grandmother     Social History Social History   Tobacco Use  . Smoking status: Never Smoker  . Smokeless tobacco: Never Used  Substance Use Topics  . Alcohol use: Yes    Comment: occ  . Drug use: No     Allergies   Nitric oxide [nitrogen oxide] and Pregabalin   Review of Systems Review of Systems   Physical Exam Triage Vital Signs ED Triage Vitals  Enc Vitals Group     BP 01/30/19 1740 (!) 141/81     Pulse Rate 01/30/19 1740 (!) 107     Resp 01/30/19 1740 17     Temp 01/30/19 1740 98 F (36.7 C)     Temp Source 01/30/19 1740 Oral     SpO2 01/30/19 1740 100 %     Weight --      Height --      Head Circumference --      Peak Flow --      Pain Score 01/30/19 1738 0     Pain Loc --      Pain Edu? --      Excl. in [place]? --    No data found.  Updated Vital Signs BP (!) 141/81 (BP Location: Left Arm)   Pulse (!) 107   Temp 98 F (36.7 C) (Oral)   Resp 17   SpO2 100%    Physical Exam Constitutional:      General: She is not in acute distress.    Appearance: She is well-developed.  Cardiovascular:     Rate and Rhythm: Normal rate.  Pulmonary:     Effort: Pulmonary effort is normal.  Skin:    General: Skin is warm and dry.  Neurological:     Mental Status: She is alert and oriented to person, place, and time.      UC Treatments / Results  Labs (all labs ordered are listed, but only abnormal results are displayed)  Labs Reviewed  NOVEL CORONAVIRUS, NAA (HOSP ORDER, SEND-OUT TO REF LAB; TAT 18-24  HRS)    EKG   Radiology No results found.  Procedures Procedures (including critical care time)  Medications Ordered in UC Medications - No data to display  Initial Impression / Assessment and Plan / UC Course  I have reviewed the triage vital signs and the nursing notes.  Pertinent labs & imaging results that were available during my care of the patient were reviewed by me and considered in my medical decision making (see chart for details).    covid-19 screening provided here today and pending. Discussed timeframe of exposure, likely out of this timeframe to be ill related to known exposure. Return precautions provided. Patient verbalized understanding and agreeable to plan.   Final Clinical Impressions(s) / UC Diagnoses   Final diagnoses:  Exposure to COVID-19 virus  Encounter for laboratory testing for COVID-19 virus     Discharge Instructions     Self isolate until covid results are back and negative.  Will notify you by phone of any positive findings. Your negative results will be sent through your MyChart.        ED Prescriptions    None     PDMP not reviewed this encounter.   Zigmund Gottron, NP 01/31/19 [phone]

## 2019-01-31 LAB — NOVEL CORONAVIRUS, NAA (HOSP ORDER, SEND-OUT TO REF LAB; TAT 18-24 HRS): SARS-CoV-2, NAA: DETECTED — AB

## 2019-02-01 ENCOUNTER — Telehealth: Payer: Self-pay | Admitting: Unknown Physician Specialty

## 2019-02-01 ENCOUNTER — Telehealth (HOSPITAL_COMMUNITY): Payer: Self-pay | Admitting: Emergency Medicine

## 2019-02-01 NOTE — Telephone Encounter (Signed)
Your test for COVID-19 was positive, meaning that you were infected with the novel coronavirus and could give the germ to others.  Please continue isolation at home for at least 10 days since the start of your symptoms. If you do not have symptoms, please isolate at home for 10 days from the day you were tested. Once you complete your 10 day quarantine, you may return to normal activities as long as you've not had a fever for over 24 hours(without taking fever reducing medicine) and your symptoms are improving. Please continue good preventive care measures, including:  frequent hand-washing, avoid touching your face, cover coughs/sneezes, stay out of crowds and keep a 6 foot distance from others.  Go to the nearest hospital emergency room if fever/cough/breathlessness are severe or illness seems like a threat to life.  Patient contacted by phone and made aware of    results. Pt verbalized understanding, she had no questions.

## 2019-02-01 NOTE — Telephone Encounter (Signed)
  I connected by phone with [name] on 02/01/2019 at 6:38 PM to discuss the potential use of an new treatment for mild to moderate COVID-19 viral infection in non-hospitalized patients.  This patient is a 68 y.o. female that meets the FDA criteria for Emergency Use Authorization of bamlanivimab or casirivimab\imdevimab.  Has a (+) direct SARS-CoV-2 viral test result  Has mild or moderate COVID-19   Is ? 66 years of age and weighs ? 40 kg  Is NOT hospitalized due to COVID-19  Is NOT requiring oxygen therapy or requiring an increase in baseline oxygen flow rate due to COVID-19  Is within 10 days of symptom onset  Has at least one of the high risk factor(s) for progression to severe COVID-19 and/or hospitalization as defined in EUA.  Specific high risk criteria : >/= 66 yo   I have spoken and communicated the following to the patient or parent/caregiver:  1. FDA has authorized the emergency use of bamlanivimab and casirivimab\imdevimab for the treatment of mild to moderate COVID-19 in adults and pediatric patients with positive results of direct SARS-CoV-2 viral testing who are 74 years of age and older weighing at least 40 kg, and who are at high risk for progressing to severe COVID-19 and/or hospitalization.  2. The significant known and potential risks and benefits of bamlanivimab and casirivimab\imdevimab, and the extent to which such potential risks and benefits are unknown.  3. Information on available alternative treatments and the risks and benefits of those alternatives, including clinical trials.  4. Patients treated with bamlanivimab and casirivimab\imdevimab should continue to self-isolate and use infection control measures (e.g., wear mask, isolate, social distance, avoid sharing personal items, clean and disinfect "high touch" surfaces, and frequent handwashing) according to CDC guidelines.   5. The patient or parent/caregiver has the option to accept or refuse  bamlanivimab or casirivimab\imdevimab .  After reviewing this information with the patient, she wants to think about it Lori Lawrence 02/01/2019 6:38 PM

## 2020-02-01 ENCOUNTER — Encounter: Payer: Self-pay | Admitting: Physician Assistant

## 2020-02-01 ENCOUNTER — Other Ambulatory Visit: Payer: Self-pay

## 2020-02-01 ENCOUNTER — Ambulatory Visit (INDEPENDENT_AMBULATORY_CARE_PROVIDER_SITE_OTHER): Payer: Medicare Other | Admitting: Physician Assistant

## 2020-02-01 DIAGNOSIS — Z1283 Encounter for screening for malignant neoplasm of skin: Secondary | ICD-10-CM

## 2020-02-01 DIAGNOSIS — L304 Erythema intertrigo: Secondary | ICD-10-CM | POA: Diagnosis not present

## 2020-02-01 DIAGNOSIS — L700 Acne vulgaris: Secondary | ICD-10-CM | POA: Diagnosis not present

## 2020-02-01 DIAGNOSIS — D485 Neoplasm of uncertain behavior of skin: Secondary | ICD-10-CM

## 2020-02-01 MED ORDER — ALCLOMETASONE DIPROPIONATE 0.05 % EX CREA
TOPICAL_CREAM | Freq: Two times a day (BID) | CUTANEOUS | 11 refills | Status: DC
Start: 2020-02-01 — End: 2021-02-05

## 2020-02-01 NOTE — Patient Instructions (Signed)

## 2020-02-01 NOTE — Progress Notes (Signed)
   Follow-Up Visit   Subjective  Lori Lawrence is a 69 y.o. female who presents for the following: Annual Exam (Concerns rash under skin fold on abdomen. Possible yeast. It is worse on the summer from heat. Used OTC eczema cream. It helped a ton. ).   The following portions of the chart were reviewed this encounter and updated as appropriate:  Tobacco  Allergies  Meds  Problems  Med Hx  Surg Hx  Fam Hx      Objective  Well appearing patient in no apparent distress; mood and affect are within normal limits.  A full examination was performed including scalp, head, eyes, ears, nose, lips, neck, chest, axillae, abdomen, back, buttocks, bilateral upper extremities, bilateral lower extremities, hands, feet, fingers, toes, fingernails, and toenails. All findings within normal limits unless otherwise noted below.  Objective  Head to toe: No atypical nevi No signs of non-mole skin cancer.   Objective  Head - Anterior (Face): Mostly clear. Has pimples on occasion.  Objective  inguinal area, subpanus: Post inflammatory hyperpigmentation.  Objective  Mid Upper [place]ilion Lip: Pink scale 2.0cm to left nasal crease       Assessment & Plan  Screening exam for skin cancer Head to toe  Yearly skin exams  Acne vulgaris Head - Anterior (Face)  Discontinue Bactrim DS- she had an allergic reaction. OTC tea tree oil.  Erythema intertrigo (2) subpanus; inguinal area  OTC oatmeal lotion daily  Ordered Medications: alclomethasone (ACLOVATE) 0.05 % cream  Neoplasm of uncertain behavior of skin Mid Upper [place]ilion Lip  Skin / nail biopsy Type of biopsy: tangential   Informed consent: discussed and consent obtained   Timeout: patient name, date of birth, surgical site, and procedure verified   Procedure prep:  Patient was prepped and draped in usual sterile fashion (Non sterile) Prep type:  Chlorhexidine Anesthesia: the lesion was anesthetized in a standard fashion    Anesthetic:  1% lidocaine w/ epinephrine 1-100,000 local infiltration Instrument used: flexible razor blade   Outcome: patient tolerated procedure well   Post-procedure details: sterile dressing applied and wound care instructions given   Dressing type: petrolatum    Specimen 1 - Surgical pathology Differential Diagnosis: R/O BCC vs SCC  Check Margins: Yes   I, Dema Timmons, PA-C, have reviewed all documentation's for this visit.  The documentation on 02/01/20 for the exam, diagnosis, procedures and orders are all accurate and complete.

## 2020-02-06 ENCOUNTER — Telehealth: Payer: Self-pay | Admitting: *Deleted

## 2020-02-06 NOTE — Telephone Encounter (Signed)
-----   Message from Warren Danes, [place] sent at 02/06/2020  1:29 PM EST ----- Inform pt AK RTC if recurs

## 2020-02-06 NOTE — Telephone Encounter (Signed)
Left message for patient to call back  

## 2020-02-07 NOTE — Telephone Encounter (Signed)
Pathology results to patient.

## 2020-04-05 ENCOUNTER — Ambulatory Visit: Payer: PRIVATE HEALTH INSURANCE | Admitting: Physician Assistant

## 2021-02-05 ENCOUNTER — Other Ambulatory Visit: Payer: Self-pay

## 2021-02-05 ENCOUNTER — Encounter: Payer: Self-pay | Admitting: Physician Assistant

## 2021-02-05 ENCOUNTER — Ambulatory Visit (INDEPENDENT_AMBULATORY_CARE_PROVIDER_SITE_OTHER): Payer: Medicare Other | Admitting: Physician Assistant

## 2021-02-05 DIAGNOSIS — Z85828 Personal history of other malignant neoplasm of skin: Secondary | ICD-10-CM

## 2021-02-05 DIAGNOSIS — L820 Inflamed seborrheic keratosis: Secondary | ICD-10-CM | POA: Diagnosis not present

## 2021-02-05 DIAGNOSIS — Z1283 Encounter for screening for malignant neoplasm of skin: Secondary | ICD-10-CM | POA: Diagnosis not present

## 2021-02-05 DIAGNOSIS — D485 Neoplasm of uncertain behavior of skin: Secondary | ICD-10-CM | POA: Diagnosis not present

## 2021-02-05 DIAGNOSIS — L304 Erythema intertrigo: Secondary | ICD-10-CM

## 2021-02-05 DIAGNOSIS — L570 Actinic keratosis: Secondary | ICD-10-CM | POA: Diagnosis not present

## 2021-02-05 MED ORDER — KETOCONAZOLE 2 % EX CREA
1.0000 | TOPICAL_CREAM | Freq: Two times a day (BID) | CUTANEOUS | 11 refills | Status: AC
Start: 2021-02-05 — End: 2021-03-19

## 2021-02-05 MED ORDER — ALCLOMETASONE DIPROPIONATE 0.05 % EX CREA: TOPICAL_CREAM | Freq: Two times a day (BID) | CUTANEOUS | 11 refills | Status: AC

## 2021-02-05 MED ORDER — FLUCONAZOLE 200 MG PO TABS: 200.0000 mg | ORAL_TABLET | Freq: Every day | ORAL | 0 refills | Status: AC

## 2021-02-05 NOTE — Patient Instructions (Signed)

## 2021-02-05 NOTE — Progress Notes (Addendum)
° °  Follow-Up Visit   Subjective  Lori Lawrence is a 70 y.o. female who presents for the following: Annual Exam (Lesions on left side, left leg, right armpit, right breast, itching, no bleeding. Person history of bcc. ).   The following portions of the chart were reviewed this encounter and updated as appropriate:  Tobacco   Allergies   Meds   Problems   Med Hx   Surg Hx   Fam Hx       Objective  Well appearing patient in no apparent distress; mood and affect are within normal limits.  A full examination was performed including scalp, head, eyes, ears, nose, lips, neck, chest, axillae, abdomen, back, buttocks, bilateral upper extremities, bilateral lower extremities, hands, feet, fingers, toes, fingernails, and toenails. All findings within normal limits unless otherwise noted below.  Right Breast Hyperkeratotic scale with pink base        Left Inframammary Fold, Right Axilla, Right Inframammary Fold Macular erythema in the folds of skin.   Right Upper Cutaneous Lip Erythematous patches with gritty scale.  Left Hip (side) - Posterior Stuck-on, waxy papules and plaques.    Assessment & Plan  Neoplasm of uncertain behavior of skin Right Breast  Skin / nail biopsy Type of biopsy: tangential   Informed consent: discussed and consent obtained   Timeout: patient name, date of birth, surgical site, and procedure verified   Procedure prep:  Patient was prepped and draped in usual sterile fashion (Non sterile) Prep type:  Chlorhexidine Anesthesia: the lesion was anesthetized in a standard fashion   Anesthetic:  1% lidocaine w/ epinephrine 1-100,000 local infiltration Instrument used: flexible razor blade   Outcome: patient tolerated procedure well   Post-procedure details: sterile dressing applied and wound care instructions given   Dressing type: bandage and petrolatum    Specimen 1 - Surgical pathology Differential Diagnosis: R/O BCC vs SCC  Check Margins:  YES  Erythema intertrigo Left Inframammary Fold; Right Inframammary Fold; Right Axilla  fluconazole (DIFLUCAN) 200 MG tablet - Left Inframammary Fold, Right Axilla, Right Inframammary Fold Take 1 tablet (200 mg total) by mouth daily.  alclomethasone (ACLOVATE) 0.05 % cream - Left Inframammary Fold, Right Axilla, Right Inframammary Fold Apply topically 2 (two) times daily.  ketoconazole (NIZORAL) 2 % cream - Left Inframammary Fold, Right Axilla, Right Inframammary Fold Apply 1 application topically 2 (two) times daily.  AK (actinic keratosis) Right Upper Cutaneous Lip  Destruction of lesion - Right Upper Cutaneous Lip  Destruction method: cryotherapy   Informed consent: discussed and consent obtained   Timeout:  patient name, date of birth, surgical site, and procedure verified Lesion destroyed using liquid nitrogen: Yes   Cryotherapy cycles:  1 Outcome: patient tolerated procedure well with no complications   Post-procedure details: wound care instructions given    Inflamed seborrheic keratosis Left Hip (side) - Posterior  Destruction of lesion - Left Hip (side) - Posterior Complexity: simple   Destruction method: cryotherapy   Informed consent: discussed and consent obtained   Timeout:  patient name, date of birth, surgical site, and procedure verified Lesion destroyed using liquid nitrogen: Yes   Cryotherapy cycles:  3 Outcome: patient tolerated procedure well with no complications   Post-procedure details: wound care instructions given      I, Mehmet Scally, PA-C, have reviewed all documentation's for this visit.  The documentation on 02/20/21 for the exam, diagnosis, procedures and orders are all accurate and complete.

## 2021-02-25 ENCOUNTER — Telehealth: Payer: Self-pay

## 2021-02-25 NOTE — Telephone Encounter (Signed)
Phone call from patient wanting her pathology results. Pathology results given to patient.  

## 2021-04-03 ENCOUNTER — Ambulatory Visit: Payer: Medicare Other | Admitting: Physician Assistant

## 2021-09-11 ENCOUNTER — Ambulatory Visit (INDEPENDENT_AMBULATORY_CARE_PROVIDER_SITE_OTHER): Payer: Medicare Other | Admitting: Physician Assistant

## 2021-09-11 ENCOUNTER — Encounter: Payer: Self-pay | Admitting: Physician Assistant

## 2021-09-11 DIAGNOSIS — L219 Seborrheic dermatitis, unspecified: Secondary | ICD-10-CM

## 2021-09-11 MED ORDER — KETOCONAZOLE 2 % EX SHAM: MEDICATED_SHAMPOO | CUTANEOUS | 11 refills | Status: AC

## 2021-09-11 MED ORDER — CLOBETASOL PROPIONATE 0.05 % EX SOLN: 1.0000 | Freq: Two times a day (BID) | CUTANEOUS | 11 refills | Status: AC

## 2021-09-24 ENCOUNTER — Encounter: Payer: Self-pay | Admitting: Physician Assistant

## 2021-09-24 NOTE — Progress Notes (Signed)
   Follow-Up Visit   Subjective  Lori Lawrence is a 71 y.o. 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 Current treatment is Remicade and she mixed alclometasone cream and ketoconazole cream for flares. ).   The following portions of the chart were reviewed this encounter and updated as appropriate:  Tobacco  Allergies  Meds  Problems  Med Hx  Surg Hx  Fam Hx      Objective  Well appearing patient in no apparent distress; mood and affect are within normal limits.  All skin waist up examined.  Left Concha, Right Cavum, Scalp Erythematous plaques with greasy scale.    Assessment & Plan  Seborrheic dermatitis Left Concha; Right Cavum; Scalp  clobetasol (TEMOVATE) 0.05 % external solution - Left Concha, Right Cavum, Scalp Apply 1 Application topically 2 (two) times daily.  ketoconazole (NIZORAL) 2 % shampoo - Left Concha, Right Cavum, Scalp Apply to scalp and let sit 3-5 minutes then rinse.    I, Zhyon Antenucci, PA-C, have reviewed all documentation's for this visit.  The documentation on 09/24/21 for the exam, diagnosis, procedures and orders are all accurate and complete.

## 2022-02-05 ENCOUNTER — Ambulatory Visit: Payer: Medicare Other | Admitting: Physician Assistant
